# Patient Record
Sex: Male | Born: 1985 | Race: White | Hispanic: No | Marital: Single
Health system: Midwestern US, Community
[De-identification: ages and names within clinical notes are randomized; demographics above are authoritative.]

---

## 2013-12-19 ENCOUNTER — Inpatient Hospital Stay
Admit: 2013-12-19 | Discharge: 2013-12-23 | Disposition: A | Discharge status: Home / Self Care | Attending: Emergency Medicine | Admitting: Emergency Medicine

## 2013-12-19 DIAGNOSIS — A419 Sepsis, unspecified organism: Secondary | ICD-10-CM

## 2013-12-19 LAB — CBC WITH AUTOMATED DIFF
ABS. BASOPHILS: 0 10*3/uL (ref 0.0–0.06)
ABS. EOSINOPHILS: 0 10*3/uL (ref 0.0–0.4)
ABS. LYMPHOCYTES: 1 10*3/uL (ref 0.9–3.6)
ABS. MONOCYTES: 1.5 10*3/uL — ABNORMAL HIGH (ref 0.05–1.2)
ABS. NEUTROPHILS: 22 10*3/uL — ABNORMAL HIGH (ref 1.8–8.0)
BASOPHILS: 0 % (ref 0–2)
EOSINOPHILS: 0 % (ref 0–5)
HCT: 45.4 % (ref 36.0–48.0)
HGB: 16.2 g/dL — ABNORMAL HIGH (ref 13.0–16.0)
LYMPHOCYTES: 4 % — ABNORMAL LOW (ref 21–52)
MCH: 30 PG (ref 24.0–34.0)
MCHC: 35.7 g/dL (ref 31.0–37.0)
MCV: 84.1 FL (ref 74.0–97.0)
MONOCYTES: 6 % (ref 3–10)
MPV: 9.8 FL (ref 9.2–11.8)
NEUTROPHILS: 90 % — ABNORMAL HIGH (ref 40–73)
PLATELET: 314 10*3/uL (ref 135–420)
RBC: 5.4 M/uL (ref 4.70–5.50)
RDW: 13.2 % (ref 11.6–14.5)
WBC: 24.5 10*3/uL — ABNORMAL HIGH (ref 4.6–13.2)

## 2013-12-19 LAB — METABOLIC PANEL, COMPREHENSIVE
A-G Ratio: 1.5 (ref 0.8–1.7)
ALT (SGPT): 22 U/L (ref 16–61)
AST (SGOT): 18 U/L (ref 15–37)
Albumin: 4.7 g/dL (ref 3.4–5.0)
Alk. phosphatase: 70 U/L (ref 45–117)
Anion gap: 14 mmol/L (ref 3.0–18)
BUN/Creatinine ratio: 15 (ref 12–20)
BUN: 17 MG/DL (ref 7.0–18)
Bilirubin, total: 0.6 MG/DL (ref 0.2–1.0)
CO2: 22 mmol/L (ref 21–32)
Calcium: 9.5 MG/DL (ref 8.5–10.1)
Chloride: 105 mmol/L (ref 100–108)
Creatinine: 1.12 MG/DL (ref 0.6–1.3)
GFR est AA: >60 mL/min/{1.73_m2} (ref >60)
GFR est non-AA: >60 mL/min/{1.73_m2} (ref >60)
Globulin: 3.2 g/dL (ref 2.0–4.0)
Glucose: 143 mg/dL — ABNORMAL HIGH (ref 74–99)
Potassium: 3.7 mmol/L (ref 3.5–5.5)
Protein, total: 7.9 g/dL (ref 6.4–8.2)
Sodium: 141 mmol/L (ref 136–145)

## 2013-12-19 LAB — LACTIC ACID
Lactic acid: 2.8 MMOL/L — ABNORMAL HIGH (ref 0.4–2.0)
Lactic acid: 1.2 MMOL/L (ref 0.4–2.0)

## 2013-12-19 LAB — LIPASE: Lipase: 85 U/L (ref 73–393)

## 2013-12-19 MED ORDER — PANTOPRAZOLE 40 MG IV SOLR
40 mg | INTRAVENOUS | Status: AC
Start: 2013-12-19 — End: 2013-12-19
  Administered 2013-12-19: 18:00:00 via INTRAVENOUS

## 2013-12-19 MED ORDER — ONDANSETRON (PF) 4 MG/2 ML INJECTION
4 mg/2 mL | Freq: Once | INTRAMUSCULAR | Status: AC
Start: 2013-12-19 — End: 2013-12-19
  Administered 2013-12-19: 20:00:00 via INTRAVENOUS

## 2013-12-19 MED ORDER — PROMETHAZINE 25 MG/ML INJECTION
25 mg/mL | Freq: Once | INTRAMUSCULAR | Status: DC
Start: 2013-12-19 — End: 2013-12-19

## 2013-12-19 MED ORDER — HYDROMORPHONE (PF) 1 MG/ML IJ SOLN
1 mg/mL | Freq: Once | INTRAMUSCULAR | Status: AC
Start: 2013-12-19 — End: 2013-12-19
  Administered 2013-12-19: 20:00:00 via INTRAVENOUS

## 2013-12-19 MED ORDER — MORPHINE 4 MG/ML SYRINGE
4 mg/mL | INTRAMUSCULAR | Status: AC
Start: 2013-12-19 — End: 2013-12-19
  Administered 2013-12-19: 18:00:00 via INTRAVENOUS

## 2013-12-19 MED ORDER — IOVERSOL 320 MG/ML IV SYRINGE
320 mg iodine/mL | Freq: Once | INTRAVENOUS | Status: AC
Start: 2013-12-19 — End: 2013-12-19
  Administered 2013-12-19: 22:00:00 via INTRAVENOUS

## 2013-12-19 MED ORDER — HYDROMORPHONE (PF) 1 MG/ML IJ SOLN
1 mg/mL | Freq: Once | INTRAMUSCULAR | Status: AC
Start: 2013-12-19 — End: 2013-12-20

## 2013-12-19 MED ORDER — METRONIDAZOLE IN SODIUM CHLORIDE (ISO-OSM) 500 MG/100 ML IV PIGGY BACK
500 mg/100 mL | INTRAVENOUS | Status: AC
Start: 2013-12-19 — End: 2013-12-19
  Administered 2013-12-20: via INTRAVENOUS

## 2013-12-19 MED ADMIN — sodium chloride 0.9 % bolus infusion 1,000 mL: INTRAVENOUS | @ 18:00:00 | NDC 00409798309

## 2013-12-19 MED ADMIN — sodium chloride 0.9 % bolus infusion 1,000 mL: INTRAVENOUS | @ 20:00:00 | NDC 00409798309

## 2013-12-19 MED ADMIN — famotidine (PEPCID) IVPB 20 mg: INTRAVENOUS | @ 19:00:00 | NDC 00338519741

## 2013-12-19 MED ADMIN — ciprofloxacin (CIPRO) 400 mg IVPB (premix): INTRAVENOUS | @ 23:00:00 | NDC 00409477702

## 2013-12-19 MED ADMIN — sodium chloride 0.9 % bolus infusion 1,000 mL: INTRAVENOUS | @ 19:00:00 | NDC 00409798309

## 2013-12-19 MED ADMIN — promethazine (PHENERGAN) 12.5 mg in 0.9% sodium chloride 50 mL IVPB: INTRAVENOUS | @ 19:00:00 | NDC 00641092821

## 2013-12-19 MED FILL — SODIUM CHLORIDE 0.9 % IV: INTRAVENOUS | Qty: 1000

## 2013-12-19 MED FILL — FAMOTIDINE (PF) 20 MG/2 ML IV: 20 mg/2 mL | INTRAVENOUS | Qty: 4

## 2013-12-19 MED FILL — PROMETHAZINE 25 MG/ML INJECTION: 25 mg/mL | INTRAMUSCULAR | Qty: 0.5

## 2013-12-19 MED FILL — PROTONIX 40 MG INTRAVENOUS SOLUTION: 40 mg | INTRAVENOUS | Qty: 40

## 2013-12-19 MED FILL — METRONIDAZOLE IN SODIUM CHLORIDE (ISO-OSM) 500 MG/100 ML IV PIGGY BACK: 500 mg/100 mL | INTRAVENOUS | Qty: 100

## 2013-12-19 MED FILL — TRANSDERM-SCOP 1 MG OVER 3 DAYS TRANSDERMAL PATCH: 1 mg over 3 days | TRANSDERMAL | Qty: 1

## 2013-12-19 MED FILL — MORPHINE 4 MG/ML SYRINGE: 4 mg/mL | INTRAMUSCULAR | Qty: 1

## 2013-12-19 MED FILL — ONDANSETRON (PF) 4 MG/2 ML INJECTION: 4 mg/2 mL | INTRAMUSCULAR | Qty: 2

## 2013-12-19 MED FILL — CIPROFLOXACIN IN D5W 400 MG/200 ML IV PIGGY BACK: 400 mg/200 mL | INTRAVENOUS | Qty: 200

## 2013-12-19 MED FILL — OPTIRAY 320 MG IODINE/ML INTRAVENOUS SYRINGE: 320 mg iodine/mL | INTRAVENOUS | Qty: 100

## 2013-12-19 MED FILL — HYDROMORPHONE (PF) 1 MG/ML IJ SOLN: 1 mg/mL | INTRAMUSCULAR | Qty: 1

## 2013-12-19 MED FILL — FAMOTIDINE 20 MG IN 50 ML NS IVPB: 20 mg/50 mL | INTRAVENOUS | Qty: 20

## 2013-12-19 MED FILL — PROCHLORPERAZINE 25 MG RECTAL SUPPOSITORY: 25 mg | RECTAL | Qty: 1

## 2013-12-19 NOTE — ED Notes (Signed)
He has been having N/V/D since this am. He is having chills.

## 2013-12-19 NOTE — ED Notes (Deleted)
Pt states he feels better with the catheter, pt states the feeling of pressure and urge to void is gone for the first time in 3 days.

## 2013-12-19 NOTE — ED Notes (Signed)
Pt medicated as ordered for continued right upper quadrant abdominal pain. Pt has IV ns infusing with out difficulty. Pt is no longer dry heaving, call light with in reach lights turned down for added comfort. Pt mother at bedside. Pt verbalizes understanding of waiting for CT . Pt reports he last had pizza with hot sauce last night and he started to vomit this am.

## 2013-12-19 NOTE — Progress Notes (Signed)
Called nadine to sed patient for CT of Abd Pel

## 2013-12-19 NOTE — ED Notes (Signed)
Bedside report complete. Patient was introduced to oncoming MGM MIRAGEKelly Beaurmier, Charity fundraiserN. Patient updated on plan of care. Safety check performed: Bed locked in low posiiton. Side rails up. Call bell and personal items within reach. Mother at bed side. Patient continues to have intermittent vomiting

## 2013-12-19 NOTE — H&P (Signed)
Medicine History and Physical    George [name] is a 28 y.o. male who is being admitted for sepsis due to colitis  Primary Care Provider:  None  Date of Admission:  12/19/2013    Assessment/Plan   1. Sepsis due to colitis'  2. Colitis, infectious v inflammatory . 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 Leukocytosis related to above  4. Protein calorie malnutrition    PLAN:  - admit to medicine  - IV hydration  - start cipro/ flagyl  - obtain stool culture, cdiff pcr, stool O &P, CRP, ESR, ANA, pANCA, HIV  - obtain records from recent hospitalization  - consult gastroenterology      HPI:   CC: nausea/ vomitign diarrhea  MOMIN MISKO is a 28 y.o. male with no significant past medical history presents with acute onset of epigastric abdominal pain associated w nausea, vomiting and diarrhea. He reports his symptoms started this morning and have progressed in nature, prompting him to seek emergent care as he has not been able to keep down any po. His mother at bedside reports that since his deployment to [place] 4 years ago, he has had multiple episodes of similar symptoms, requiring hospitalization. Once he was hospitalized in [place] upon his return, and he was hospitalized [facility] years ago [facility]. He had an EGD and colonoscopy at that time which she reports was " negative". Despite this he continues to lose weight despite adequate po intake.     On presentation to the Er he was afebrile, hypotensive with out tachcyardia. He is cachectic and malnourished. Abdomin was soft, but with diffuse tenderness in his epigastryum. Labs w significant leukocytosis w left shift, elevated lactic acid. CT abdomen pelvis with diffuse mural thickening of rectum, sigmoid and left colon. Medicine is asked to admit for further management.         History reviewed. No pertinent past medical history.  Past Surgical History    Procedure Laterality Date   ??? Hx cholecystectomy        History   Substance Use Topics   ??? Smoking status: Current Every Day Smoker -- 0.50 packs/day   ??? Smokeless tobacco: Not on file   ??? Alcohol Use: No     History reviewed. No pertinent family history.  No current facility-administered medications on file prior to encounter.     No current outpatient prescriptions on file prior to encounter.      No Known Allergies      Review of Systems  Constitutional: No fever, chills,+weight loss - falls  Skin: no itching or rashes  HEENT: no ear discomfort, hearing loss, tinnitus, epistaxis or sore throat  EYES: no blurry vision, double vision or photophobia  CARDIOVASCULAR: no claudication, cp, palpitations, orthopnea, pnd or LE edema  PULMONARY: no cough, wheeze, shortness of breath or sputum production  GI: + nausea, +vomiting,+ diarrhea, +abdominal pain,- melena, hematemesis or brbpr  GU: no dysuria, hematuria  MUSCULOSKELETAL: no back pain, joint pain or myalgias  ENDOCRINE: no heat/cold intolerance, polyuria or polydipsia  HEME: no easy bruising or bleeding  NEURO: no unilateral weakness, numbness, tingling or seizures      Physical Exam:      Visit Vitals   Item Reading   ??? BP 108/64   ??? Pulse 63   ??? Temp 98.1 ??F (36.7 ??C)   ??? Resp 16   ???  Ht 5' 6" (1.676 m)   ??? Wt 61.236 kg (135 lb)   ??? BMI 21.80 kg/m2   ??? SpO2 97%     Body mass index is 21.8 kg/(m^2).    Physical Exam:  GEN: thin  HEENT: atraumatic, hearing adequate, nose normal,oropharynx clear, MMdry  NECK: supple, trachea midline, no supraclavicular or submandibular adenopathy noted  EYES: conjuctiva normal, lids with out lesions, PERRL  HEART: RRR with out m/r/g, pmi nondisplaced, pulses 2+ distally  LUNGS: equal chest wall expansion, cta bl with out wheezes/rales or rhonchi  AB: soft, +BS, nt/nd no organomegaly  NEURO: alert, awake and oriented x3, gait not assessed, cranial nerves intact, strength 5/5 bl UE and LE, sensation intact, reflexes nonpathological   SKIN: dry, intact, warm no breakdown noted        Laboratory Studies:    BMP:   Lab Results   Component Value Date/Time    NA 141 12/19/2013  1:25 PM    K 3.7 12/19/2013  1:25 PM    CL 105 12/19/2013  1:25 PM    CO2 22 12/19/2013  1:25 PM    AGAP 14 12/19/2013  1:25 PM    GLU 143* 12/19/2013  1:25 PM    BUN 17 12/19/2013  1:25 PM    CREA 1.12 12/19/2013  1:25 PM    GFRAA >60 12/19/2013  1:25 PM    GFRNA >60 12/19/2013  1:25 PM     CBC:   Lab Results   Component Value Date/Time    WBC 24.5* 12/19/2013  1:25 PM    HGB 16.2* 12/19/2013  1:25 PM    HCT 45.4 12/19/2013  1:25 PM    PLT 314 12/19/2013  1:25 PM       Imaging studies personally reviewed:  CT AB/PELVIS: " 1. Diffuse mural thickening of the rectum, sigmoid colon, and left colon  suggesting an infectious/inflammatory colitis. Clinical correlation is  recommended.  2. Previous cholecystectomy."       Berline Chough, DO  Internal Medicine/Geriatrics

## 2013-12-19 NOTE — ED Notes (Signed)
Patient has vomited twice more since he has been there.

## 2013-12-19 NOTE — Other (Signed)
Bedside shift change report given to C. Adhiambo (Cabin crewoncoming nurse) by Silas Sacramentoortni B Kunzie, RN   (offgoing nurse). Report included the following information SBAR, Kardex, ED Summary, [facility] and Recent Results.

## 2013-12-19 NOTE — ED Notes (Signed)
Assumed care of patient, bedside report received from Barbie Robins RN.

## 2013-12-19 NOTE — ED Notes (Signed)
TRANSFER - OUT REPORT:    Verbal report given to Jerl Minaourtney Kunzie RN(name) on Ryder SystemCorey R Hardrick  being transferred to Medical (unit) for routine progression of care       Report consisted of patient???s Situation, Background, Assessment and   Recommendations(SBAR).     Information from the following report(s) SBAR and ED Summary was reviewed with the receiving nurse.    Opportunity for questions and clarification was provided.    Courtney verbalizes understanding that the Flagyl abx will go to floor with patient to be started once the cipro completes.

## 2013-12-19 NOTE — Progress Notes (Addendum)
Received patient from the off going nurse,he's alert and oriented x4 verbal and able to make needs known.Vitals stable with diastolic blood pressure  In the 40's but patient reports this to be normal.Assesment completed per doc flow sheet ,patient refused both flu and pneumonia vaccines MD made  Aware will continue to monitor.  2247  Patient complaining of severe abdominal pain and emesis episodes, greenish brown large amount of vomit noted.Zofran administered,MD contacted and i.v dilaudid ordered  0715  The rest of the night remain calm,with pain well managed and no more emesis episodes.

## 2013-12-19 NOTE — ED Notes (Signed)
Pt dozing on stretcher with lights turned down for added comfort. Pt mother remains at bedside and verbalizes understanding of going to CT shortly, call light with in reach.

## 2013-12-19 NOTE — ED Notes (Signed)
This rn was told by ed rn that she spoke with lab and per the lab they will draw the lab work that was just added on by Dr. Pauletta BrownsAlmedia with morning labs because that lab work is a send out and would not be sent out until the morning anyway.

## 2013-12-19 NOTE — Progress Notes (Signed)
1835: Received pt from ED at this time. Pt c/o N/V, had small amount of green colored emesis and watery stool. Ciprofloxacin infusing at 200 ml/h, mother at bedside. Call bell in reach.

## 2013-12-19 NOTE — ED Provider Notes (Signed)
HPI Comments: 1:12 PM  George [name] 28 y.o. male presents to ED via EMS C/O NVD onset this morning. Associated sxs include chills and epgiastric abdominal pain- sharp, stabbing, doesn't radiate. Symptoms moderate-severe. No blood. No hx pancreatitis, PUD, GI bleed. No etoh use. No sick contacts or any other sxs or complaints. PSHx + chole.     The history is provided by the patient. No language interpreter was used.        History reviewed. No pertinent past medical history.     Past Surgical History   Procedure Laterality Date   ??? Hx cholecystectomy           History reviewed. No pertinent family history.     History     Social History   ??? Marital Status: SINGLE     Spouse Name: N/A     Number of Children: N/A   ??? Years of Education: N/A     Occupational History   ??? Not on file.     Social History Main Topics   ??? Smoking status: Current Every Day Smoker -- 0.50 packs/day   ??? Smokeless tobacco: Not on file   ??? Alcohol Use: No   ??? Drug Use: Yes     Special: Marijuana   ??? Sexual Activity: Not on file     Other Topics Concern   ??? Not on file     Social History Narrative   ??? No narrative on file                  ALLERGIES: Review of patient's allergies indicates no known allergies.      Review of Systems   Constitutional: Positive for chills. Negative for fever and fatigue.   HENT: Negative for rhinorrhea and sore throat.    Eyes: Negative for discharge.   Respiratory: Negative for cough.    Gastrointestinal: Positive for nausea, vomiting, abdominal pain and diarrhea. Negative for blood in stool.   Genitourinary: Negative for decreased urine volume.   Musculoskeletal: Negative for myalgias and arthralgias.   Skin: Negative for color change, pallor, rash and wound.   Neurological: Negative for dizziness, weakness and headaches.   Hematological: Negative.    Psychiatric/Behavioral: Negative.        Filed Vitals:    12/19/13 1330 12/19/13 1430 12/19/13 1517 12/19/13 1630   BP: 104/58 116/72 114/48 108/51   Pulse: 71 63  63    Temp:       Resp: 17 25 14     Height:       Weight:       SpO2:   100% 97%            Physical Exam   Nursing note and vitals reviewed.  NURSING NOTE AND VITALS REVIEWED.    PSHX, PFHX, PMHX REVIEWED.    CONSTITUTIONAL: Well developed, well nourished. Awake & alert. No acute respiratory distress, non-toxic in appearance, not diaphoretic. Afebrile. Ill appearing.     HEAD: Atraumatic, Normocephalic.    EYES: Extra-ocular muscles are intact. Sclera are anicteric.     ENT: Ears normal to external inspection. Mucous membranes dry. Nares patent. OP is clear. No trismus. No stridor. Secretions controlled.     NECK:  Neck is supple without meningismus.    CARDIOVASCULAR: Regular rate and rhythm, no murmurs, rubs, or gallops.      PULMONARY/CHEST:  Clear to auscultation bilaterally. No wheezing, rales or rhonchi. Patient speaking in full sentences without accessory muscle usage.  ABDOMINAL: Soft and non-distended. Active BS all quadrants. Generalized tenderness, greatest in RUQ w/ + guarding. No rebound.    MUSCULOSKELETAL: Full range of motion in all extremities.     SKIN:  Skin is warm and dry. No diaphoresis, rashes or lesions visible. Intact skin.     NEUROLOGICAL: Alert, awake. Appropriately oriented. Normal speech.          RESULTS    Labs Reviewed   CBC WITH AUTOMATED DIFF - Abnormal; Notable for the following:     WBC 24.5 (*)     HGB 16.2 (*)     NEUTROPHILS 90 (*)     LYMPHOCYTES 4 (*)     ABS. NEUTROPHILS 22.0 (*)     ABS. MONOCYTES 1.5 (*)     All other components within normal limits   METABOLIC PANEL, COMPREHENSIVE - Abnormal; Notable for the following:     Glucose 143 (*)     All other components within normal limits   LACTIC ACID, PLASMA - Abnormal; Notable for the following:     Lactic acid 2.8 (*)     All other components within normal limits   CULTURE, BLOOD   LIPASE   LACTIC ACID, PLASMA     Recent Results (from the past 12 hour(s))   CBC WITH AUTOMATED DIFF    Collection Time     12/19/13  1:25  PM       Result Value Ref Range    WBC 24.5 (*) 4.6 - 13.2 K/uL    RBC 5.40  4.70 - 5.50 M/uL    HGB 16.2 (*) 13.0 - 16.0 g/dL    HCT 45.4  36.0 - 48.0 %    MCV 84.1  74.0 - 97.0 FL    MCH 30.0  24.0 - 34.0 PG    MCHC 35.7  31.0 - 37.0 g/dL    RDW 13.2  11.6 - 14.5 %    PLATELET 314  135 - 420 K/uL    MPV 9.8  9.2 - 11.8 FL    NEUTROPHILS 90 (*) 40 - 73 %    LYMPHOCYTES 4 (*) 21 - 52 %    MONOCYTES 6  3 - 10 %    EOSINOPHILS 0  0 - 5 %    BASOPHILS 0  0 - 2 %    ABS. NEUTROPHILS 22.0 (*) 1.8 - 8.0 K/UL    ABS. LYMPHOCYTES 1.0  0.9 - 3.6 K/UL    ABS. MONOCYTES 1.5 (*) 0.05 - 1.2 K/UL    ABS. EOSINOPHILS 0.0  0.0 - 0.4 K/UL    ABS. BASOPHILS 0.0  0.0 - 0.06 K/UL    RBC COMMENTS NORMOCYTIC, NORMOCHROMIC      WBC COMMENTS SLIDE ESTIMATE CONFIRMS WBC      DF AUTOMATED     METABOLIC PANEL, COMPREHENSIVE    Collection Time     12/19/13  1:25 PM       Result Value Ref Range    Sodium 141  136 - 145 mmol/L    Potassium 3.7  3.5 - 5.5 mmol/L    Chloride 105  100 - 108 mmol/L    CO2 22  21 - 32 mmol/L    Anion gap 14  3.0 - 18 mmol/L    Glucose 143 (*) 74 - 99 mg/dL    BUN 17  7.0 - 18 MG/DL    Creatinine 1.12  0.6 - 1.3 MG/DL    BUN/Creatinine ratio 15  12 -  20      GFR est AA >60  >60 ml/min/1.25m    GFR est non-AA >60  >60 ml/min/1.771m   Calcium 9.5  8.5 - 10.1 MG/DL    Bilirubin, total 0.6  0.2 - 1.0 MG/DL    ALT 22  16 - 61 U/L    AST 18  15 - 37 U/L    Alk. phosphatase 70  45 - 117 U/L    Protein, total 7.9  6.4 - 8.2 g/dL    Albumin 4.7  3.4 - 5.0 g/dL    Globulin 3.2  2.0 - 4.0 g/dL    A-G Ratio 1.5  0.8 - 1.7     LIPASE    Collection Time     12/19/13  1:25 PM       Result Value Ref Range    Lipase 85  73 - 393 U/L   LACTIC ACID, PLASMA    Collection Time     12/19/13  1:25 PM       Result Value Ref Range    Lactic acid 2.8 (*) 0.4 - 2.0 MMOL/L         History reviewed. No pertinent past medical history.   XR ABD ACUTE W 1 V CHEST    (Results Pending)   CT ABD PELV W CONT    (Results Pending)        MDM  Number of  Diagnoses or Management Options  Abdominal pain, generalized:   Intractable nausea and vomiting:   Pancolitis:   Diagnosis management comments:   DDx: consider biliary, hepatitis, pancreatitis, gastritis, viral syndrome,gastroparesis, SBO, appendicitis, diverticulitis, colitis    + colitis w/ white count, intractable N/V and continued ABD pain. WaJuliandmission for continued IVF/IVABX/antiemetics. Dr. AlBuel Reamgrees to admit. Plan discussed with his mom.                Amount and/or Complexity of Data Reviewed  Clinical lab tests: ordered and reviewed  Tests in the radiology section of CPT??: ordered and reviewed  Discuss the patient with other providers: yes (Dr.Almeida-IM)  Independent visualization of images, tracings, or specimens: yes        MEDICATIONS  Medications   scopolamine (TRANSDERM-SCOP) 1.5 mg (not administered)   ciprofloxacin (CIPRO) 400 mg IVPB (premix) (not administered)   metroNIDAZOLE (FLAGYL) IVPB premix 500 mg (not administered)   HYDROmorphone (PF) (DILAUDID) injection 0.5 mg (not administered)   sodium chloride 0.9 % bolus infusion 1,000 mL (0 mL IntraVENous IV Completed 12/19/13 1514)   sodium chloride 0.9 % bolus infusion 1,000 mL (0 mL IntraVENous IV Completed 12/19/13 1403)   pantoprazole (PROTONIX) injection 40 mg (40 mg IntraVENous Given 12/19/13 1323)   morphine injection 4 mg (4 mg IntraVENous Given 12/19/13 1323)   promethazine (PHENERGAN) 12.5 mg in 0.9% sodium chloride 50 mL IVPB (0 mg IntraVENous IV Completed 12/19/13 1339)   famotidine (PEPCID) IVPB 20 mg (0 mg IntraVENous IV Completed 12/19/13 1432)   ondansetron (ZOFRAN) injection 4 mg (4 mg IntraVENous Given 12/19/13 1516)   HYDROmorphone (PF) (DILAUDID) injection 1 mg (1 mg IntraVENous Given 12/19/13 1516)   sodium chloride 0.9 % bolus infusion 1,000 mL (0 mL IntraVENous IV Completed 12/19/13 1607)   ioversol (OPTIRAY) 320 mg iodine/mL contrast injection 100 mL (100 mL IntraVENous Given 12/19/13 1651)         Procedures    PROGRESS  NOTES:    3:51 PM  Pt's mother requested to speak privately, endorses ongoing chronic  abd pain, 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 Has been extensively evaluated inducing endoscopy and colonoscopy without source for sxs. Pt is a veteran, has had extensive ETOH hx in the past but nothing recent. Lives at home with parents. Moms does not think any recent drinking and unsure of drug use.   Written by Lillia Mountain, ED Scribe, as dictated by Marden Noble, PA-C.        CONSULT NOTE:   5:35 PM  Marden Noble, PA-C spoke with [facility]   Specialty: Internal Medicine   Discussed pt's hx, disposition, and available diagnostic and imaging results. Reviewed care plans. Consulting physician agrees with plans as outlined. Agrees to admit pt to medical and suggests Cipro and Flagyl IV antibiotics to be started.   Written by Celine Mans, ED Scribe, as dictated by Marden Noble, PA-C.     ADMISSION NOTE:  5:35 PM  Patient is being admitted to the hospital by Dr.Almeida. The results of their tests and reasons for their admission have been discussed with them and/or available family. They convey agreement and understanding for the need to be admitted and for their admission diagnosis.    Written by Celine Mans, ED Scribe, as dictated by Marden Noble, PA-C.     CONDITIONS ON ADMISSION:  MRSA is not present at the time of admission. Wound infection is not present at the time of admission. Pressure Ulcer is not present at the time of admission.     ________________________________________________________________________  CLINICAL IMPRESSION    1. Pancolitis    2. Intractable nausea and vomiting    3. Abdominal pain, generalized                  Written by Lillia Mountain, ED Scribe, as dictated by Marden Noble, PA-C.

## 2013-12-19 NOTE — ED Notes (Signed)
Pt resting on stretcher, pt remains drowsy, pt mother at the bedside and was helping to answer questions when dr. Pauletta BrownsAlmedia was at the bedside.

## 2013-12-20 LAB — METABOLIC PANEL, BASIC
Anion gap: 11 mmol/L (ref 3.0–18)
BUN/Creatinine ratio: 11 — ABNORMAL LOW (ref 12–20)
BUN: 11 MG/DL (ref 7.0–18)
CO2: 23 mmol/L (ref 21–32)
Calcium: 8.2 MG/DL — ABNORMAL LOW (ref 8.5–10.1)
Chloride: 107 mmol/L (ref 100–108)
Creatinine: 0.96 MG/DL (ref 0.6–1.3)
GFR est AA: >60 mL/min/{1.73_m2} (ref >60)
GFR est non-AA: >60 mL/min/{1.73_m2} (ref >60)
Glucose: 95 mg/dL (ref 74–99)
Potassium: 3.3 mmol/L — ABNORMAL LOW (ref 3.5–5.5)
Sodium: 141 mmol/L (ref 136–145)

## 2013-12-20 LAB — C REACTIVE PROTEIN, QT: C-Reactive protein: 2.8 mg/dL — ABNORMAL HIGH (ref 0–0.3)

## 2013-12-20 LAB — SED RATE (ESR): Sed rate, automated: 1 mm/hr (ref 0–15)

## 2013-12-20 LAB — CBC WITH AUTOMATED DIFF
ABS. BASOPHILS: 0 10*3/uL (ref 0.0–0.06)
ABS. EOSINOPHILS: 0 10*3/uL (ref 0.0–0.4)
ABS. LYMPHOCYTES: 0.9 10*3/uL (ref 0.9–3.6)
ABS. MONOCYTES: 0.8 10*3/uL (ref 0.05–1.2)
ABS. NEUTROPHILS: 11.6 10*3/uL — ABNORMAL HIGH (ref 1.8–8.0)
BASOPHILS: 0 % (ref 0–2)
EOSINOPHILS: 0 % (ref 0–5)
HCT: 37 % (ref 36.0–48.0)
HGB: 13 g/dL (ref 13.0–16.0)
LYMPHOCYTES: 6 % — ABNORMAL LOW (ref 21–52)
MCH: 29.5 PG (ref 24.0–34.0)
MCHC: 35.1 g/dL (ref 31.0–37.0)
MCV: 84.1 FL (ref 74.0–97.0)
MONOCYTES: 6 % (ref 3–10)
MPV: 10 FL (ref 9.2–11.8)
NEUTROPHILS: 88 % — ABNORMAL HIGH (ref 40–73)
PLATELET: 215 10*3/uL (ref 135–420)
RBC: 4.4 M/uL — ABNORMAL LOW (ref 4.70–5.50)
RDW: 13.3 % (ref 11.6–14.5)
WBC: 13.3 10*3/uL — ABNORMAL HIGH (ref 4.6–13.2)

## 2013-12-20 LAB — LACTIC ACID: Lactic acid: 1 MMOL/L (ref 0.4–2.0)

## 2013-12-20 LAB — C. DIFFICILE/EPI PCR

## 2013-12-20 LAB — MAGNESIUM: Magnesium: 1.7 mg/dL — ABNORMAL LOW (ref 1.8–2.4)

## 2013-12-20 LAB — PHOSPHORUS: Phosphorus: 1.9 MG/DL — ABNORMAL LOW (ref 2.5–4.9)

## 2013-12-20 MED ADMIN — ondansetron (ZOFRAN) injection 4 mg: INTRAVENOUS | @ 19:00:00 | NDC 10019090501

## 2013-12-20 MED ADMIN — HYDROmorphone (PF) (DILAUDID) injection 1 mg: INTRAVENOUS | @ 17:00:00 | NDC 00409128331

## 2013-12-20 MED ADMIN — 0.9% sodium chloride infusion: INTRAVENOUS | NDC 00409798309

## 2013-12-20 MED ADMIN — 0.9% sodium chloride infusion: INTRAVENOUS | @ 12:00:00 | NDC 00409798309

## 2013-12-20 MED ADMIN — HYDROmorphone (PF) (DILAUDID) injection 1 mg: INTRAVENOUS | @ 09:00:00 | NDC 00409128331

## 2013-12-20 MED ADMIN — ondansetron (ZOFRAN) injection 4 mg: INTRAVENOUS | @ 03:00:00 | NDC 23155037831

## 2013-12-20 MED ADMIN — enoxaparin (LOVENOX) injection 40 mg: SUBCUTANEOUS | @ 03:00:00 | NDC 00075801401

## 2013-12-20 MED ADMIN — enoxaparin (LOVENOX) injection 40 mg: SUBCUTANEOUS | NDC 00075801401

## 2013-12-20 MED ADMIN — HYDROmorphone (PF) (DILAUDID) injection 1 mg: INTRAVENOUS | @ 04:00:00 | NDC 00409128331

## 2013-12-20 MED ADMIN — ondansetron (ZOFRAN) injection 4 mg: INTRAVENOUS | @ 14:00:00 | NDC 23155037831

## 2013-12-20 MED ADMIN — 0.9% sodium chloride infusion: INTRAVENOUS | @ 22:00:00 | NDC 00409798309

## 2013-12-20 MED ADMIN — HYDROmorphone (PF) (DILAUDID) injection 1 mg: INTRAVENOUS | @ 13:00:00 | NDC 00409128331

## 2013-12-20 MED FILL — ONDANSETRON (PF) 4 MG/2 ML INJECTION: 4 mg/2 mL | INTRAMUSCULAR | Qty: 2

## 2013-12-20 MED FILL — LOVENOX 40 MG/0.4 ML SUBCUTANEOUS SYRINGE: 40 mg/0.4 mL | SUBCUTANEOUS | Qty: 0.4

## 2013-12-20 MED FILL — HYDROMORPHONE (PF) 1 MG/ML IJ SOLN: 1 mg/mL | INTRAMUSCULAR | Qty: 1

## 2013-12-20 NOTE — Other (Signed)
[place] [address]  NPI: [id number] / Tax ID: [id number]    Patient [employer] ID: [id number]    Colitis 558.9   12/19/2013 - Present         H&P Notes    H&P by Berline Chough, DO at 12/19/13 1755 Version 1 of 1   Author: Berline Chough, DO Service: Internal Medicine Author Type: Physician   Filed: 12/19/13 1808 Note Time: 12/19/13 1755 Status: Signed   Editor: Berline Chough, DO (Physician)       Medicine History and Physical    George [name] is a 28 y.o. male who is being admitted for sepsis due to colitis  Primary Care Provider: None  Date of Admission: 12/19/2013  Assessment/Plan   1. Sepsis due to colitis'  2. Colitis, infectious v inflammatory . 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 Leukocytosis related to above  4. Protein calorie malnutrition    PLAN:  - admit to medicine  - IV hydration  - start cipro/ flagyl  - obtain stool culture, cdiff pcr, stool O &P, CRP, ESR, ANA, pANCA, HIV  - obtain records from recent hospitalization  - consult gastroenterology    HPI:   CC: nausea/ vomitign diarrhea  George [name] is a 28 y.o. male with no significant past medical history presents with acute onset of epigastric abdominal pain associated w nausea, vomiting and diarrhea. He reports his symptoms started this morning and have progressed in nature, prompting him to seek emergent care as he has not been able to keep down any po. His mother at bedside reports that since his deployment to [place] 4 years ago, he has had multiple episodes of similar symptoms, requiring hospitalization. Once he was hospitalized in [place] upon his return, and he was hospitalized 2 years ago [facility]. He had an EGD and colonoscopy at that time which she reports was " negative". Despite this he continues to lose weight despite adequate po intake.     On presentation to the  Er he was afebrile, hypotensive with out tachcyardia. He is cachectic and malnourished. Abdomin was soft, but with diffuse tenderness in his epigastryum. Labs w significant leukocytosis w left shift, elevated lactic acid. CT abdomen pelvis with diffuse mural thickening of rectum, sigmoid and left colon. Medicine is asked to admit for further management.    Past Medical History    History reviewed. No pertinent past medical history.    Past Surgical History    Past Surgical History   Procedure Laterality Date   ??? Hx cholecystectomy        History   Substance Use Topics   ??? Smoking status: Current Every Day Smoker -- 0.50 packs/day   ??? Smokeless tobacco: Not on file   ??? Alcohol Use: No   Family History    History reviewed. No pertinent family history.   No current facility-administered medications on file prior to encounter.   No current outpatient prescriptions on file prior to encounter.   No Known Allergies      Review of Systems  Constitutional: No fever, chills,+weight loss - falls  Skin: no itching or rashes  HEENT: no ear discomfort, hearing loss, tinnitus, epistaxis or sore throat  EYES: no blurry vision, double vision or photophobia  CARDIOVASCULAR: no claudication, cp, palpitations, orthopnea, pnd or LE edema  PULMONARY:  no cough, wheeze, shortness of breath or sputum production  GI: + nausea, +vomiting,+ diarrhea, +abdominal pain,- melena, hematemesis or brbpr  GU: no dysuria, hematuria  MUSCULOSKELETAL: no back pain, joint pain or myalgias  ENDOCRINE: no heat/cold intolerance, polyuria or polydipsia  HEME: no easy bruising or bleeding  NEURO: no unilateral weakness, numbness, tingling or seizures    Physical Exam:   Visit Vitals   Item Reading   ??? BP 108/64   ??? Pulse 63   ??? Temp 98.1 ??F (36.7 ??C)   ??? Resp 16   ??? Ht 5' 6"  (1.676 m)   ??? Wt 61.236 kg (135 lb)   ??? BMI 21.80 kg/m2   ??? SpO2 97%   Body mass index is 21.8 kg/(m^2).    Physical Exam:  GEN: thin  HEENT: atraumatic, hearing adequate, nose  normal,oropharynx clear, MMdry  NECK: supple, trachea midline, no supraclavicular or submandibular adenopathy noted  EYES: conjuctiva normal, lids with out lesions, PERRL  HEART: RRR with out m/r/g, pmi nondisplaced, pulses 2+ distally  LUNGS: equal chest wall expansion, cta bl with out wheezes/rales or rhonchi  AB: soft, +BS, nt/nd no organomegaly  NEURO: alert, awake and oriented x3, gait not assessed, cranial nerves intact, strength 5/5 bl UE and LE, sensation intact, reflexes nonpathological  SKIN: dry, intact, warm no breakdown noted    Laboratory Studies:    BMP:   Lab Results   Component Value Date/Time    NA 141 12/19/2013 1:25 PM    K 3.7 12/19/2013 1:25 PM    CL 105 12/19/2013 1:25 PM    CO2 22 12/19/2013 1:25 PM    AGAP 14 12/19/2013 1:25 PM    GLU 143* 12/19/2013 1:25 PM    BUN 17 12/19/2013 1:25 PM    CREA 1.12 12/19/2013 1:25 PM    GFRAA >60 12/19/2013 1:25 PM    GFRNA >60 12/19/2013 1:25 PM   CBC:   Lab Results   Component Value Date/Time    WBC 24.5* 12/19/2013 1:25 PM    HGB 16.2* 12/19/2013 1:25 PM    HCT 45.4 12/19/2013 1:25 PM    PLT 314 12/19/2013 1:25 PM     Imaging studies personally reviewed:  CT AB/PELVIS: " 1. Diffuse mural thickening of the rectum, sigmoid colon, and left colon  suggesting an infectious/inflammatory colitis. Clinical correlation is  recommended.  2. Previous cholecystectomy."    Berline Chough, DO  Internal Medicine/Geriatrics

## 2013-12-20 NOTE — Progress Notes (Signed)
Readmission Risk Assessment:     Low Risk and MSSP/Good Help ACO patients    RRAT Score:  1 - 9    Initial Assessment: Chart reviewed, noted 28 yr old admitted from home with sepsis due to colitis; noted pt with [facility] ins.  Attempted to meet with pt who was not available.  Will f/u pt in the am.       Emergency Contact:      Pertinent Medical Hx: cholecystectomy        PCP/Specialists:       Community Services:       DME:         Low Risk Care Transition Plan:  1. [facility], community care coordination of resources  2. Involve patient/caregiver in assessment, planning, education and implement of intervention.  3. CM daily patient care huddles/interdisciplinary rounds.  4. PCP/Specialist appointment within 7 - 10 days made prior to discharge.  5. Facilitate transportation and logistics for follow-up appointments.  6. Handoff to [facility] Nurse Navigator or PCP practice.

## 2013-12-20 NOTE — Other (Signed)
Bedside and Verbal shift change report given to C.Kunzi RN (oncoming nurse) by Caroline Adhiambo, RN   (offgoing nurse). Report included the following information SBAR, Kardex and MAR.

## 2013-12-20 NOTE — Progress Notes (Signed)
Medicine Progress Note        Patient: George [name]        Sex: male          DOA: 12/19/2013  Date of Birth:  1985-12-08      Age:  28 y.o.        LOS:  LOS: 1 day   Assessment/Plan   1. Sepsis due to colitis  2. Colitis, likely infectious, improving  3. Leukocytosis, improved  4. Protein calorie malnutrition    Plan:  - continue cipro/ flagyl  - IV fluids  - clear liquid  - follow culture data              Subjective:     Pt feeling better  Still has some nausea, but asking to eat  Pain resolved    REVIEW OF SYSTEMS: NO CP, SOB, N,V,D, F,C  Objective:      Visit Vitals   Item Reading   ??? BP 104/45   ??? Pulse 89   ??? Temp 99.1 ??F (37.3 ??C)   ??? Resp 18   ??? Ht 5\' 6"  (1.676 m)   ??? Wt 56.427 kg (124 lb 6.4 oz)   ??? BMI 20.09 kg/m2   ??? SpO2 98%     Body mass index is 20.09 kg/(m^2).    Physical Exam:  GEN: Alert and oriented times three NAD, thin  EYES: conjunctiva normal, lids with out lesions  HEENT: MMM, No thyromegaly, no lymphadenopathy  HEART: RRR +S1 +S2, no JVD, pulses 2+ distally  LUNGS: CTA B/L no wheezes, rales or rhonchi  ABDOMEN: + BS, soft NT/ND no organomegaly,  No rebound  EXTREMITIES: No edema cyanosis   SKIN: no rashes or skin breakdown, no nodules  Current Facility-Administered Medications   Medication Dose Route Frequency   ??? scopolamine (TRANSDERM-SCOP) 1.5 mg  1.5 mg TransDERmal NOW   ??? 0.9% sodium chloride infusion  125 mL/hr IntraVENous CONTINUOUS   ??? ondansetron (ZOFRAN) injection 4 mg  4 mg IntraVENous Q4H PRN   ??? prochlorperazine (COMPAZINE) suppository 25 mg  25 mg Rectal Q12H PRN   ??? acetaminophen (TYLENOL) suppository 650 mg  650 mg Rectal Q4H PRN   ??? acetaminophen (TYLENOL) tablet 650 mg  650 mg Oral Q4H PRN   ??? enoxaparin (LOVENOX) injection 40 mg  40 mg SubCUTAneous Q24H   ??? HYDROmorphone (PF) (DILAUDID) injection 1 mg  1 mg IntraVENous Q3H PRN         All the patient's labs over the past 24 hours were reviewed both during my initial daily workflow process and at  the time notated as "note time" in [facility].  (It is not time stamped separately in this workflow.)  Select labs are listed below.      Lab/Data Reviewed:  CMP:   Lab Results   Component Value Date/Time    NA 141 12/20/2013  4:20 AM    K 3.3* 12/20/2013  4:20 AM    CL 107 12/20/2013  4:20 AM    CO2 23 12/20/2013  4:20 AM    AGAP 11 12/20/2013  4:20 AM    GLU 95 12/20/2013  4:20 AM    BUN 11 12/20/2013  4:20 AM    CREA 0.96 12/20/2013  4:20 AM    GFRAA >60 12/20/2013  4:20 AM    GFRNA >60 12/20/2013  4:20 AM    CA 8.2* 12/20/2013  4:20 AM    MG 1.7* 12/19/2013  1:25 PM  PHOS 1.9* 12/19/2013  1:25 PM    TP 7.9 12/19/2013  1:25 PM    ALB 4.7 12/19/2013  1:25 PM    GLOB 3.2 12/19/2013  1:25 PM    AGRAT 1.5 12/19/2013  1:25 PM    SGOT 18 12/19/2013  1:25 PM    ALT 22 12/19/2013  1:25 PM     CBC:   Lab Results   Component Value Date/Time    WBC 13.3* 12/20/2013  4:20 AM    HGB 13.0 12/20/2013  4:20 AM    HCT 37.0 12/20/2013  4:20 AM    PLT 215 12/20/2013  4:20 AM     Recent Glucose Results:   Lab Results   Component Value Date/Time    GLU 95 12/20/2013  4:20 AM    GLU 143* 12/19/2013  1:25 PM           Esperanza RichtersJacob Leander Tout, DO  Internal Medicine/Geriatrics

## 2013-12-20 NOTE — Consults (Signed)
[facility]                              [address]                          [facility] [place] [address]                                   CONSULTATION    PATIENT:   George [name], George [name]  MRN:           [id number]     DATE:  BILLING:       [id number]    ROOM :    [id number]  INTERPRETI Rozetta Nunnery, MD  NG:  REFERRING: Josphat Musapatike      GASTROENTEROLOGY EVALUATION    HISTORY OF PRESENT ILLNESS: This is a 28 year old white male who was  referred to me for evaluation of his nausea, vomiting and diarrhea episodes  that he presented here with. 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 He had a severe episode [place]  [place] and ended up having his  gallbladder taken out and told there was some fluid around his pancreas  suggestive of inflammation. He has had multiple attacks since that point.  Initially they would occur about 5-6 times a month. In between his stools  were always on the soft or looser side, but now they are down to about 2-3  times a month with these episodes. He was seen at [facility] at one point  and said he had an upper endoscopy and colonoscopy and those all came back  okay. He has had many stool studies done and was told that those were all  okay. These episodes, when they do occur, they can last anywhere from 2  days to 2 weeks. He notes that prior to going to [place] he weighed somewhere  in the 140-150 range, now he is in the 120 range and cannot seem to gain  weight higher than that and when he gets these episodes it usually drops  his weight down a degree and he has to struggle to get it back up to the  starting weight again. 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    PAST MEDICAL HISTORY: Essentially none except for what is noted above.    ALLERGIES: HE HAS NO KNOWN ALLERGIES TO MEDICATIONS.    MEDICATIONS: None at presentation.    PAST SURGICAL HISTORY: Cholecystectomy, wisdom teeth.    FAMILY HISTORY: Both parents alive and well.    SOCIAL HISTORY: Positive for cigarette smoking, about 1/2 a pack per day.  Negative for alcohol intake and he does use marijuana occasionally.    PHYSICAL EXAMINATION  GENERAL: This is a thin male resting comfortably in bed.  VITAL SIGNS: Show blood pressure 104/45 with a pulse rate 89. He is  afebrile, his pulse oximetry is 98%.  HEENT: He has anicteric sclerae,  pink conjunctivae. Pupils equal and  reactive to light. Extraocular muscles are intact. Mouth exam has good  dentition upper and lower with moist mucous membranes, but no other mucosal  or lip abnormalities.  NECK: Midline trachea. No palpable thyroid or nodes.  LUNGS: Clear to auscultation and percussion.  HEART: Has an S1, S2 with a regular rate and rhythm. No murmur  appreciated.  ABDOMEN: Has positive bowel sounds. It is soft. It is nondistended. There  is no hepatosplenomegaly. There are no masses. He has mild tenderness in  the right upper quadrant area and epigastric area.  RECTAL: Exam deferred at this time.  EXTREMITIES: No cyanosis, clubbing, or edema.  NEUROLOGIC/PSYCHIATRIC: Alert and oriented to person, place, time with good  judgment.    LABORATORY DATA: At presentation showed a white count 24,500 with  hemoglobin 16.2, hematocrit 45.1, MCV 84.1, and a platelet count of  314,000. Repeat labs show white blood cell count of 13,300, hemoglobin and  hematocrit down to 13 and 37. Initially sodium 141, potassium 3.7, chloride  105, CO2 22, glucose 143, BUN 17, creatinine of 1.12, calcium 9.5,  phosphorus 1.9, magnesium 1.7, bilirubin 0.6, total protein 7.9 with an  albumin of 4.7, globulin of 3.2, ALT 22, AST 18, alkaline phosphatase 70,  lipase of 85. Lactic acid  initially 2.8. Subsequently, the 2 lactic acids  were down to 1.2 and 1.0 respectively, and his glucose went down to 95, his  calcium went down to 8.2. His blood cultures, stool cultures, stool O and  P, and C difficile are all pending at this point. He also has pending.  C-reactive protein, HIV, antineutrophilic cytoplasmic antibodies and  antinuclear antibodies are all pending.    His CAT scan shows diffuse mural thickening of the rectum, sigmoid and left  colon with previous cholecystectomy changes, otherwise unremarkable.    ASSESSMENT: This is a 28 year old male who comes in with the above history.  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 He has had evaluations  before and nothing significant was found at those points in time.  Unfortunately, I do not have those results. He has already had his  gallbladder taken out for this and the flares seem to be easing off with  the frequency but they could last for extended period times it is affecting  his weight, but now he has got what appears to be a superimposed symptom on  top of his chronic with the CAT scan findings with the white count being so  elevated and now improving. But again I think this is more superimposed  with obvious findings on top of chronic symptoms without a clear cause for  that.    PLAN: At this point, I agree with the current course of his IV fluids that  he is on with his antinausea medication and pain medications as needed with  waiting for the stool results to come back and the only new workup that was  done and just observing for the time being without further invasive repeat  testing needing done at this point in time, with what has been found  before and just making the necessary adjustments as his course dictates.  Will follow and help with.  Rozetta NunneryKeith Roth Ress, MD       KL:wmx  D: 12/20/2013 07:35 A   T: 12/20/2013 08:03 A  BY:  [id number]  #:  [phone]  CScriptDoc #:  [id number]    cc:   Dena BilletJacob D Almeida, DO        Rozetta NunneryKeith Shailynn Fong, MD        Josphat Musapatike

## 2013-12-20 NOTE — Other (Signed)
Please clarify the severity of the patient's protein-calorie malnutrition as Mild, Moderate or Severe    The medical record reflects the following clinical findings, treatment, and risk factors:    28yo male presented w/ N/V/D. He has had multiple w/u's for similar symptoms over the last 4 years. His BMI = 21.8. Albumin 4.7, t. prot. 7.9. He is being treated w/ IV cipro and flagyl, NS @ 125, currently NPO.    Please clarify and document your clinical opinion in the progress notes and discharge summary including the definitive and/or presumptive diagnosis, (suspected or probable), related to the above clinical findings. Please include clinical findings supporting your diagnosis.    If you DECLINE this query or would like to communicate with CDMP, please utilize the "CDMP message box" at the very bottom of the Progress Note on the right.

## 2013-12-20 NOTE — Progress Notes (Signed)
Pt summary: Uneventful day shift, pt had some c/o of pain and n/v, n/v mostly after eating or moving around. Treated with Zofran and Dilaudid. Pts urine output for entire shift. IV of NS infusing at 125 ml/h, no complications.

## 2013-12-20 NOTE — Progress Notes (Signed)
Full note dictated [phone]#[id number]    A/P - Colitis - noted by CT with marked leukocytosis at presentation, likely a superimposed issue on top of a chronic problem without a clear etiology for it with evaluations before for this.  Plan - await stool studies a labs sent off, continue symptomatic course for now, continue IV hydration, no further testing at this time unless course dictates the need for it and will follow.      Rozetta NunneryKeith Tinslee Klare, M.D.

## 2013-12-20 NOTE — Other (Signed)
Bedside shift change report given to M. Wylene SimmerPollard RN (Cabin crewoncoming nurse) by Silas Sacramentoortni B Kunzie, RN   (offgoing nurse). Report included the following information SBAR, Kardex, ED Summary, Intake/Output and MAR, urine output, N/V, diarrhea.

## 2013-12-20 NOTE — Other (Signed)
[facility] [place] [address]  NPI: [id number] / Tax ID: [id number]  Patient???s ID: [id number]    DX: Colitis 558.9            H&P Notes    H&P by Berline Chough, DO at 12/19/13 1755 Version 1 of 1   Author: Berline Chough, DO Service: Internal Medicine Author Type: Physician   Filed: 12/19/13 1808 Note Time: 12/19/13 1755 Status: Signed   Editor: Berline Chough, DO (Physician)     Expand All Collapse All       Medicine History and Physical    George [name] is a 28 y.o. male who is being admitted for sepsis due to colitis  Primary Care Provider: None  Date of Admission: 12/19/2013  Assessment/Plan   1. Sepsis due to colitis'  2. Colitis, infectious v inflammatory . 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 Leukocytosis related to above  4. Protein calorie malnutrition    PLAN:  - admit to medicine  - IV hydration  - start cipro/ flagyl  - obtain stool culture, cdiff pcr, stool O &P, CRP, ESR, ANA, pANCA, HIV  - obtain records from recent hospitalization  - consult gastroenterology    HPI:   CC: nausea/ vomitign diarrhea  George [name] is a 28 y.o. male with no significant past medical history presents with acute onset of epigastric abdominal pain associated w nausea, vomiting and diarrhea. He reports his symptoms started this morning and have progressed in nature, prompting him to seek emergent care as he has not been able to keep down any po. His mother at bedside reports that since his deployment to [place] 4 years ago, he has had multiple episodes of similar symptoms, requiring hospitalization. Once he was hospitalized in [place] upon his return, and he was [facility] [place] [facility]. He had an EGD and colonoscopy at that time which she reports was " negative". Despite this he continues to lose weight despite adequate po intake.     On presentation  to the Er he was afebrile, hypotensive with out tachcyardia. He is cachectic and malnourished. Abdomin was soft, but with diffuse tenderness in his epigastryum. Labs w significant leukocytosis w left shift, elevated lactic acid. CT abdomen pelvis with diffuse mural thickening of rectum, sigmoid and left colon. Medicine is asked to admit for further management.          Past Medical History    History reviewed. No pertinent past medical history.    Past Surgical History    Past Surgical History   Procedure Laterality Date   ??? Hx cholecystectomy        History   Substance Use Topics   ??? Smoking status: Current Every Day Smoker -- 0.50 packs/day   ??? Smokeless tobacco: Not on file   ??? Alcohol Use: No   Family History    History reviewed. No pertinent family history.   No current facility-administered medications on file prior to encounter.   No current outpatient prescriptions on file prior to encounter.   No Known Allergies      Review of Systems  Constitutional: No fever, chills,+weight loss - falls  Skin: no itching or rashes  HEENT: no ear discomfort, hearing loss, tinnitus, epistaxis or sore throat  EYES: no blurry vision, double vision or photophobia  CARDIOVASCULAR: no  claudication, cp, palpitations, orthopnea, pnd or LE edema  PULMONARY: no cough, wheeze, shortness of breath or sputum production  GI: + nausea, +vomiting,+ diarrhea, +abdominal pain,- melena, hematemesis or brbpr  GU: no dysuria, hematuria  MUSCULOSKELETAL: no back pain, joint pain or myalgias  ENDOCRINE: no heat/cold intolerance, polyuria or polydipsia  HEME: no easy bruising or bleeding  NEURO: no unilateral weakness, numbness, tingling or seizures    Physical Exam:   Visit Vitals   Item Reading   ??? BP 108/64   ??? Pulse 63   ??? Temp 98.1 ??F (36.7 ??C)   ??? Resp 16   ??? Ht _0  (1.676 m)   ??? Wt 61.236 kg (135 lb)   ??? BMI 21.80 kg/m2   ??? SpO2 97%   Body mass index is 21.8 kg/(m^2).    Physical Exam:  GEN: thin  HEENT: atraumatic, hearing adequate,  nose normal,oropharynx clear, MMdry  NECK: supple, trachea midline, no supraclavicular or submandibular adenopathy noted  EYES: conjuctiva normal, lids with out lesions, PERRL  HEART: RRR with out m/r/g, pmi nondisplaced, pulses 2+ distally  LUNGS: equal chest wall expansion, cta bl with out wheezes/rales or rhonchi  AB: soft, +BS, nt/nd no organomegaly  NEURO: alert, awake and oriented x3, gait not assessed, cranial nerves intact, strength 5/5 bl UE and LE, sensation intact, reflexes nonpathological  SKIN: dry, intact, warm no breakdown noted        Laboratory Studies:    BMP:   Lab Results   Component Value Date/Time    NA 141 12/19/2013 1:25 PM    K 3.7 12/19/2013 1:25 PM    CL 105 12/19/2013 1:25 PM    CO2 22 12/19/2013 1:25 PM    AGAP 14 12/19/2013 1:25 PM    GLU 143* 12/19/2013 1:25 PM    BUN 17 12/19/2013 1:25 PM    CREA 1.12 12/19/2013 1:25 PM    GFRAA >60 12/19/2013 1:25 PM    GFRNA >60 12/19/2013 1:25 PM   CBC:   Lab Results   Component Value Date/Time    WBC 24.5* 12/19/2013 1:25 PM    HGB 16.2* 12/19/2013 1:25 PM    HCT 45.4 12/19/2013 1:25 PM    PLT 314 12/19/2013 1:25 PM     Imaging studies personally reviewed:  CT AB/PELVIS: " 1. Diffuse mural thickening of the rectum, sigmoid colon, and left colon  suggesting an infectious/inflammatory colitis. Clinical correlation is  recommended.  2. Previous cholecystectomy."    Berline Chough, DO  Internal Medicine/Geriatrics

## 2013-12-21 LAB — ANTI-NEUTROPHIL CYTOPLASMIC AB
ATYPICAL PANCA: <1:20 {titer}
Atypical pANCA: <1:20 {titer}
Cytoplasmic (C-ANCA) Ab: <1:20 {titer}
Cytoplasmic (C-ANCA) Ab: <1:20 {titer}
PERINUCLEAR (P-ANCA), 162401: <1:20 {titer}
Perinuclear (P-ANCA): <1:20 {titer}

## 2013-12-21 LAB — ANA BY MULTIPLEX FLOW IA, QL
ANA, Direct: NEGATIVE
ANA: NEGATIVE

## 2013-12-21 LAB — CBC WITH AUTOMATED DIFF
ABS. BASOPHILS: 0 10*3/uL (ref 0.0–0.06)
ABS. EOSINOPHILS: 0.1 10*3/uL (ref 0.0–0.4)
ABS. LYMPHOCYTES: 2 10*3/uL (ref 0.9–3.6)
ABS. MONOCYTES: 1.4 10*3/uL — ABNORMAL HIGH (ref 0.05–1.2)
ABS. NEUTROPHILS: 2.8 10*3/uL (ref 1.8–8.0)
BASOPHILS: 0 % (ref 0–2)
EOSINOPHILS: 2 % (ref 0–5)
HCT: 35.8 % — ABNORMAL LOW (ref 36.0–48.0)
HGB: 12.3 g/dL — ABNORMAL LOW (ref 13.0–16.0)
LYMPHOCYTES: 32 % (ref 21–52)
MCH: 29.2 PG (ref 24.0–34.0)
MCHC: 34.4 g/dL (ref 31.0–37.0)
MCV: 85 FL (ref 74.0–97.0)
MONOCYTES: 22 % — ABNORMAL HIGH (ref 3–10)
MPV: 9.6 FL (ref 9.2–11.8)
NEUTROPHILS: 44 % (ref 40–73)
PLATELET: 189 10*3/uL (ref 135–420)
RBC: 4.21 M/uL — ABNORMAL LOW (ref 4.70–5.50)
RDW: 13.4 % (ref 11.6–14.5)
WBC: 6.3 10*3/uL (ref 4.6–13.2)

## 2013-12-21 LAB — METABOLIC PANEL, BASIC
Anion gap: 9 mmol/L (ref 3.0–18)
BUN/Creatinine ratio: 9 — ABNORMAL LOW (ref 12–20)
BUN: 9 MG/DL (ref 7.0–18)
CO2: 26 mmol/L (ref 21–32)
Calcium: 8.3 MG/DL — ABNORMAL LOW (ref 8.5–10.1)
Chloride: 102 mmol/L (ref 100–108)
Creatinine: 0.95 MG/DL (ref 0.6–1.3)
GFR est AA: >60 mL/min/{1.73_m2} (ref >60)
GFR est non-AA: >60 mL/min/{1.73_m2} (ref >60)
Glucose: 84 mg/dL (ref 74–99)
Potassium: 3.5 mmol/L (ref 3.5–5.5)
Sodium: 137 mmol/L (ref 136–145)

## 2013-12-21 MED ADMIN — ondansetron (ZOFRAN) injection 4 mg: INTRAVENOUS | @ 11:00:00 | NDC 23155037831

## 2013-12-21 MED ADMIN — HYDROmorphone (PF) (DILAUDID) injection 1 mg: INTRAVENOUS | @ 11:00:00 | NDC 00409128331

## 2013-12-21 MED ADMIN — HYDROmorphone (PF) (DILAUDID) injection 1 mg: INTRAVENOUS | @ 22:00:00 | NDC 00409128331

## 2013-12-21 MED ADMIN — HYDROmorphone (PF) (DILAUDID) injection 1 mg: INTRAVENOUS | @ 19:00:00 | NDC 00409128331

## 2013-12-21 MED ADMIN — ondansetron (ZOFRAN) injection 4 mg: INTRAVENOUS | @ 22:00:00 | NDC 23155037831

## 2013-12-21 MED ADMIN — dextrose 5% and 0.9% NaCl infusion: INTRAVENOUS | @ 15:00:00 | NDC 00409794109

## 2013-12-21 MED ADMIN — HYDROmorphone (PF) (DILAUDID) injection 1 mg: INTRAVENOUS | @ 06:00:00 | NDC 00409128331

## 2013-12-21 MED ADMIN — zolpidem (AMBIEN) tablet 10 mg: ORAL | @ 04:00:00 | NDC 68084018911

## 2013-12-21 MED ADMIN — ondansetron (ZOFRAN) injection 4 mg: INTRAVENOUS | @ 16:00:00 | NDC 23155037831

## 2013-12-21 MED ADMIN — ondansetron (ZOFRAN) injection 4 mg: INTRAVENOUS | @ 06:00:00 | NDC 23155037831

## 2013-12-21 MED ADMIN — ondansetron (ZOFRAN) injection 4 mg: INTRAVENOUS | @ 02:00:00 | NDC 23155037831

## 2013-12-21 MED ADMIN — loperamide (IMODIUM) capsule 2 mg: ORAL | @ 22:00:00 | NDC 51079069001

## 2013-12-21 MED ADMIN — HYDROmorphone (PF) (DILAUDID) injection 1 mg: INTRAVENOUS | @ 15:00:00 | NDC 00409128331

## 2013-12-21 MED ADMIN — loperamide (IMODIUM) capsule 2 mg: ORAL | @ 18:00:00 | NDC 51079069001

## 2013-12-21 MED ADMIN — HYDROmorphone (PF) (DILAUDID) injection 1 mg: INTRAVENOUS | @ 02:00:00 | NDC 00409128331

## 2013-12-21 MED FILL — ONDANSETRON (PF) 4 MG/2 ML INJECTION: 4 mg/2 mL | INTRAMUSCULAR | Qty: 2

## 2013-12-21 MED FILL — LOPERAMIDE 2 MG CAP: 2 mg | ORAL | Qty: 1

## 2013-12-21 MED FILL — HYDROMORPHONE (PF) 1 MG/ML IJ SOLN: 1 mg/mL | INTRAMUSCULAR | Qty: 1

## 2013-12-21 MED FILL — ZOLPIDEM 5 MG TAB: 5 mg | ORAL | Qty: 2

## 2013-12-21 MED FILL — DEXTROSE 5% IN NORMAL SALINE IV: INTRAVENOUS | Qty: 1000

## 2013-12-21 NOTE — Progress Notes (Signed)
NUTRITION    ASSESSMENT:     Diet:  Clear Liquids  Food Allergies:  NKFA  PO Intake:  _0   Good     _1   Fair     _2   Poor     _3   N/A     No data found.      _4  No cultural, religious, or ethnic dietary needs identified   _5  Cultural, religious and ethnic food preferences identified and addressed      Skin:   _6    Reviewed    Medications:  _7    Reviewed; lovenox, D5-NS at 125 ml/hr (provides 150 g dextrose=510 kcals/d)    Labs:    _8    Reviewed   Lab Results   Component Value Date/Time    Sodium 137 12/21/2013  3:50 AM    Potassium 3.5 12/21/2013  3:50 AM    Chloride 102 12/21/2013  3:50 AM    CO2 26 12/21/2013  3:50 AM    Anion gap 9 12/21/2013  3:50 AM    Glucose 84 12/21/2013  3:50 AM    BUN 9 12/21/2013  3:50 AM    Creatinine 0.95 12/21/2013  3:50 AM    Calcium 8.3 12/21/2013  3:50 AM    Magnesium 1.7 12/19/2013  1:25 PM    Phosphorus 1.9 12/19/2013  1:25 PM    Albumin 4.7 12/19/2013  1:25 PM       Anthropometrics:  IBW : 64.411 kg (142 lb) (128-156#)  BMI (calculated): 20.7    Last 3 Recorded Weights in this Encounter    12/20/13 0019 12/20/13 2350 12/21/13 1017   Weight: 56.427 kg (124 lb 6.4 oz) 57.97 kg (127 lb 12.8 oz) 57.97 kg (127 lb 12.8 oz)      Ht Readings from Last 1 Encounters:   12/21/13 5' 6" (1.676 m)       Estimated Nutrition Needs:  Calories (kcal): 2050 Kcals/day (2050-2350 kcals/d (MSJ x 1.2-1.4 (PAL) + 250))  Protein (g): 78 g (78-91 g/d (1.2-1.4 g/kg IBW of 65 kg))  Fluid (ml): 2050 ml (2050-2350 kcals/d (23m/kcal))    Based on:  _9   Actual Wt  (calories and fluid)   _10   IBW  (protein)    Pt expected to meet estimated nutrient needs:  _11    Yes     _12   No    Summary:  BPA MST RISK Screening Referral received noting "no" response to question regarding recent unplanned weight loss; however, "unsure" response to question regarding amount of weight loss.    Pt presented with n/v/d, admitted with sepsis, colitis, leukocytosis, protein kcal malnutrition. Pt reports has had multiple episodes of similar  symptoms x four years. Per GI pt possibly with cyclic vomiting syndrome.    Currently on a clear liquids diet. Spoke to pt at bedside, stated he is not tolerating the clears, "I cannot keep anything down," reported has not been able to keep anything down since "this past Tuesday morning." Denied any weight loss PTA but reports gaining weight/maintainig current weight can be difficult, BMI indicates pt is WNL at this time.     PO nutrition not currently indicated d/t persistent n/v, if pt continues with inability to tolerate PO, pt would benefit from alternative means of nutrition support, pt currently receiving D5, providing a total of 510 kcals/d, will continue to monitor. Diet education not indicated at this time.    DIAGNOSIS:   1.   Inadequate oral intake R/T colitis, sepsis, decreased ability to consume  sufficient energy AEB pt report of persistent n/v, "I can't keep anything down," current clear liquids diet nutritionally inadequate to meet estimated needs (x 2 days)    Goals:  1. Pt will tolerate solid consistency diet, as diet advanced, upon f/u    INTERVENTIONS:   1. Continue current diet; encourage PO intakes, as tolerated; recommend advancing to regular, only as medically appropriate    MONITORING/EVALUATION:   1. Monitor PO  2. Monitor advancement of diet, intakes, tolerance, as diet advanced  3. If pt unable to tolerate PO, as diet advanced, pt would benefit from alternative means of nutrition support  4. Monitor weight  5. Evaluate per Nutrition Risk protocol:  _0   High     _1   Moderate     _2   Minimal/Uncompromised    Previous Recommendations:  _3   Implemented     _4   Not Implemented (RD to address)     <[id number]>_8<\/SXQKSKSHNGITJLLV>_7  Not applicable   Previous Goal:  _6   Met     _7   Not Met (RD to address)     <[id number]>_9<\/[id number]>_2   Not applicable     Discharge Planning:  Further nutrition recommendations, pending advancement of diet, ability of pt to tolerate regular consistency diet    Ernestina Patches, RD

## 2013-12-21 NOTE — Other (Signed)
Bedside and Verbal shift change report given to M Pollard RN (oncoming nurse) by Naomi S McGeorge, RN   (offgoing nurse). Report included the following information SBAR and MAR.

## 2013-12-21 NOTE — Progress Notes (Signed)
Gastrointestinal Progress Note    Patient Name: George [name]    JXBJY'NToday's Date: 12/21/2013    Admit Date: 12/19/2013    Subjective:     Diet: Patient is on Clear Liquid and is not tolerating.    Nausea is present. Vomiting is present.    Pain: Patient does not complain of abdominal pain.     Bowel Movements: Diarrhea    Bleeding:  None    Current Facility-Administered Medications   Medication Dose Route Frequency   ??? zolpidem (AMBIEN) tablet 10 mg  10 mg Oral QHS PRN   ??? scopolamine (TRANSDERM-SCOP) 1.5 mg  1.5 mg TransDERmal NOW   ??? 0.9% sodium chloride infusion  125 mL/hr IntraVENous CONTINUOUS   ??? ondansetron (ZOFRAN) injection 4 mg  4 mg IntraVENous Q4H PRN   ??? prochlorperazine (COMPAZINE) suppository 25 mg  25 mg Rectal Q12H PRN   ??? acetaminophen (TYLENOL) suppository 650 mg  650 mg Rectal Q4H PRN   ??? acetaminophen (TYLENOL) tablet 650 mg  650 mg Oral Q4H PRN   ??? enoxaparin (LOVENOX) injection 40 mg  40 mg SubCUTAneous Q24H   ??? HYDROmorphone (PF) (DILAUDID) injection 1 mg  1 mg IntraVENous Q3H PRN          Objective:     Visit Vitals   Item Reading   ??? BP 112/66   ??? Pulse 75   ??? Temp 97.9 ??F (36.6 ??C)   ??? Resp 16   ??? Ht 5\' 6"  (1.676 m)   ??? Wt 57.97 kg (127 lb 12.8 oz)   ??? BMI 20.64 kg/m2   ??? SpO2 98%       02/17 1900 - 02/19 0659  In: 4210 [I.V.:4210]  Out: 1755 [Urine:1755]    Lungs: clear to auscultation bilaterally  Heart: regular rate and rhythm, S1, S2 normal, no murmur  Abdomen: soft, non-tender, non-distended, bowel sounds normal    Data Review:    Labs: Results:       Chemistry Recent Labs      12/21/13   0350  12/20/13   0420  12/19/13   1325   GLU  84  95  143*   NA  137  141  141   K  3.5  3.3*  3.7   CL  102  107  105   CO2  26  23  22    BUN  9  11  17    CREA  0.95  0.96  1.12   CA  8.3*  8.2*  9.5   AGAP  9  11  14    BUCR  9*  11*  15   AP   --    --   70   TP   --    --   7.9   ALB   --    --   4.7   GLOB   --    --   3.2   AGRAT   --    --   1.5      CBC w/Diff Recent Labs      12/21/13   0350   12/20/13   0420  12/19/13   1325   WBC  6.3  13.3*  24.5*   RBC  4.21*  4.40*  5.40   HGB  12.3*  13.0  16.2*   HCT  35.8*  37.0  45.4   PLT  189  215  314   GRANS  44  88*  90*   LYMPH  32  6*  4*   EOS  2  0  0      Coagulation No results for input(s): PTP, INR, APTT in the last 72 hours.    Liver Enzymes Recent Labs      12/19/13   1325   TP  7.9   ALB  4.7   AP  70   SGOT  18          Assessment:     Active Problems:    Colitis (12/19/2013)        Plan:     Colitis - still with significant diarrhea but pain better, C Diff negative. Plan - imodium as needed, change IV to D5 NS, await pending results.    Chronic symptoms - seems suggestive of Cyclic Vomiting Syndrome as likely possibility. Plan - consider management options for this with PMD after discharge.          Raymon Mutton, MD  December 21, 2013

## 2013-12-21 NOTE — Progress Notes (Signed)
Chart reviewed, met with patient who stated that: he lived with his mother and would return home at discharge; he was independent with ADLs and used no assistive devices; his PCP was Dr Mertie Clause.  No discharge needs anticipated.  CMgr will be available should discharge needs arise.

## 2013-12-21 NOTE — Progress Notes (Signed)
Chaplain conducted an initial consultation and Spiritual Assessment for George [name], who is a 28 y.o.,male. Patient???s Primary Language is: [place].   According to the patient???s EMR Religious Affiliation is: No religion.     The reason the Patient came to the hospital is:   Patient Active Problem List    Diagnosis Date Noted   ??? Colitis 12/19/2013        The Chaplain provided the following Interventions:  Initiated a relationship of care and support.   Explored issues of faith, belief, spirituality and religious/ritual needs while hospitalized.  Listened empathically.  Provided chaplaincy education.  Provided information about Spiritual Care Services.  Offered prayer and assurance of continued prayers on patient's behalf.   Chart reviewed.    The following outcomes were achieved:  Patient shared limited information about both their medical narrative and spiritual journey/beliefs.  Patient processed feeling about current hospitalization.  Patient expressed gratitude for the Chaplain's visit.    Assessment:  Patient does not have any religious/cultural needs that will affect patient???s preferences in health care.  Patient did not indicate any spiritual or religious issues which require Spiritual Care Services interventions at this time.       Plan:  Chaplains will continue to follow and will provide pastoral care on an as needed/requested basis.  Chaplain recommends bedside caregivers page chaplain on duty if patient shows signs of acute spiritual or emotional distress.    Fr. Darliss RidgelAlex Shuter, M.[facility] [employer]  Chaplain  [phone]

## 2013-12-21 NOTE — Progress Notes (Signed)
Progress Note      Patient: George [name]               Sex: male          DOA: 12/19/2013       Date of Birth:  08/19/1986      Age:  [age] y.o.        LOS:  LOS: 2 days             CC: Diarrhea    Subjective:     Patient report persistent diarrhea despite Imodium. No new complaints.    Objective:      Visit Vitals   Item Reading   ??? BP 101/55   ??? Pulse 59   ??? Temp 98.2 ??F (36.8 ??C)   ??? Resp 16   ??? Ht 5\' 6"  (1.676 m)   ??? Wt 57.97 kg (127 lb 12.8 oz)   ??? BMI 20.64 kg/m2   ??? SpO2 99%       Physical Exam:  Gen:  Patient is in no distress.  No complaint  Lungs:  Clearly bilaterally.  No rales, no wheeze.  Normal effort.  Heart:  Regular Rate and Rhythm.  No murmur or gallop.  No JVD.  No edema.  Abdomen:  Soft, non tender, non distended.   Extremities:  No edema, good perfusion.  Neuro:  Awake, alert, and talking.  No confusion.        Lab/Data Reviewed:  CMP:   Lab Results   Component Value Date/Time    NA 137 12/21/2013  3:50 AM    K 3.5 12/21/2013  3:50 AM    CL 102 12/21/2013  3:50 AM    CO2 26 12/21/2013  3:50 AM    AGAP 9 12/21/2013  3:50 AM    GLU 84 12/21/2013  3:50 AM    BUN 9 12/21/2013  3:50 AM    CREA 0.95 12/21/2013  3:50 AM    GFRAA >60 12/21/2013  3:50 AM    GFRNA >60 12/21/2013  3:50 AM    CA 8.3* 12/21/2013  3:50 AM     CBC:   Lab Results   Component Value Date/Time    WBC 6.3 12/21/2013  3:50 AM    HGB 12.3* 12/21/2013  3:50 AM    HCT 35.8* 12/21/2013  3:50 AM    PLT 189 12/21/2013  3:50 AM           Assessment/Plan     Active Problems:    Colitis (12/19/2013)        Plan:  - continue cipro/ flagyl  - Imodium prn  - IV fluids  - clear liquid  - GI following; awaiting send out labs  - follow culture data

## 2013-12-21 NOTE — Progress Notes (Addendum)
Patient had uneventful night. Medicated for pain and nausea through out shift. Patient states that prn dilaudid and zofran helps to decrease discomfort. Still having watery BMs. VSS. IVFs infusing. No other concerns.     Bedside and Verbal shift change report given to Malena CatholicN. McGeorge, RN (oncoming nurse) by Cyd SilenceMeghan L Graviela Nodal, RN   (offgoing nurse). Report included the following information SBAR, Kardex and MAR.

## 2013-12-21 NOTE — Progress Notes (Addendum)
7:39 AM - Received report on pt from off-going nurse. In bed with HOB slightly elevated.c/o pain 5/10. Wanted heat adjusted. Very cold in room. No other complaints voiced.    9:48 AM in bed talking about plans    11:02 AM in bed resting. C/o pain continuing at 8/10 before and after pain admin.  12:57 PM in bed on phone. Requested immodium- Administered.  Total of 3 episodes of diarrhea

## 2013-12-22 LAB — HIV 1/2 AB SCREEN W RFLX CONFIRM
HIV 1/2 Interpretation: NONREACTIVE
HIV1/2 INTERPRETATION, HHIVI: NONREACTIVE

## 2013-12-22 LAB — METABOLIC PANEL, BASIC
Anion gap: 7 mmol/L (ref 3.0–18)
BUN/Creatinine ratio: 6 — ABNORMAL LOW (ref 12–20)
BUN: 6 MG/DL — ABNORMAL LOW (ref 7.0–18)
CO2: 29 mmol/L (ref 21–32)
Calcium: 8.5 MG/DL (ref 8.5–10.1)
Chloride: 104 mmol/L (ref 100–108)
Creatinine: 0.95 MG/DL (ref 0.6–1.3)
GFR est AA: >60 mL/min/{1.73_m2} (ref >60)
GFR est non-AA: >60 mL/min/{1.73_m2} (ref >60)
Glucose: 102 mg/dL — ABNORMAL HIGH (ref 74–99)
Potassium: 3.4 mmol/L — ABNORMAL LOW (ref 3.5–5.5)
Sodium: 140 mmol/L (ref 136–145)

## 2013-12-22 LAB — CBC WITH AUTOMATED DIFF
ABS. BASOPHILS: 0 10*3/uL (ref 0.0–0.06)
ABS. EOSINOPHILS: 0.1 10*3/uL (ref 0.0–0.4)
ABS. LYMPHOCYTES: 2.2 10*3/uL (ref 0.9–3.6)
ABS. MONOCYTES: 1 10*3/uL (ref 0.05–1.2)
ABS. NEUTROPHILS: 2.3 10*3/uL (ref 1.8–8.0)
BASOPHILS: 0 % (ref 0–2)
EOSINOPHILS: 2 % (ref 0–5)
HCT: 37.4 % (ref 36.0–48.0)
HGB: 13.2 g/dL (ref 13.0–16.0)
LYMPHOCYTES: 39 % (ref 21–52)
MCH: 29.1 PG (ref 24.0–34.0)
MCHC: 35.3 g/dL (ref 31.0–37.0)
MCV: 82.6 FL (ref 74.0–97.0)
MONOCYTES: 17 % — ABNORMAL HIGH (ref 3–10)
MPV: 9.7 FL (ref 9.2–11.8)
NEUTROPHILS: 42 % (ref 40–73)
PLATELET: 206 10*3/uL (ref 135–420)
RBC: 4.53 M/uL — ABNORMAL LOW (ref 4.70–5.50)
RDW: 12.9 % (ref 11.6–14.5)
WBC: 5.5 10*3/uL (ref 4.6–13.2)

## 2013-12-22 LAB — CULTURE, STOOL

## 2013-12-22 LAB — OVA & PARASITES, STOOL

## 2013-12-22 LAB — OVA & PARASITES, STOOL, REFLEX

## 2013-12-22 MED ADMIN — zolpidem (AMBIEN) tablet 10 mg: ORAL | @ 04:00:00 | NDC 68084018911

## 2013-12-22 MED ADMIN — HYDROmorphone (PF) (DILAUDID) injection 1 mg: INTRAVENOUS | @ 19:00:00 | NDC 00409128331

## 2013-12-22 MED ADMIN — HYDROmorphone (PF) (DILAUDID) injection 1 mg: INTRAVENOUS | @ 15:00:00 | NDC 00409128331

## 2013-12-22 MED ADMIN — loperamide (IMODIUM) capsule 2 mg: ORAL | @ 18:00:00 | NDC 51079069001

## 2013-12-22 MED ADMIN — dextrose 5% and 0.9% NaCl infusion: INTRAVENOUS | @ 18:00:00 | NDC 00409794109

## 2013-12-22 MED ADMIN — loperamide (IMODIUM) capsule 2 mg: ORAL | @ 22:00:00 | NDC 51079069001

## 2013-12-22 MED ADMIN — potassium chloride (K-DUR, KLOR-CON) SR tablet 40 mEq: ORAL | @ 22:00:00 | NDC 68084036011

## 2013-12-22 MED ADMIN — ondansetron (ZOFRAN) injection 4 mg: INTRAVENOUS | @ 15:00:00 | NDC 23155037831

## 2013-12-22 MED ADMIN — HYDROmorphone (PF) (DILAUDID) injection 1 mg: INTRAVENOUS | @ 04:00:00 | NDC 00409128331

## 2013-12-22 MED ADMIN — HYDROmorphone (PF) (DILAUDID) injection 1 mg: INTRAVENOUS | @ 07:00:00 | NDC 00409128331

## 2013-12-22 MED ADMIN — HYDROmorphone (PF) (DILAUDID) injection 1 mg: INTRAVENOUS | @ 22:00:00 | NDC 00409128331

## 2013-12-22 MED ADMIN — ondansetron (ZOFRAN) injection 4 mg: INTRAVENOUS | @ 22:00:00 | NDC 23155037831

## 2013-12-22 MED ADMIN — ondansetron (ZOFRAN) injection 4 mg: INTRAVENOUS | @ 04:00:00 | NDC 23155037831

## 2013-12-22 MED ADMIN — enoxaparin (LOVENOX) injection 40 mg: SUBCUTANEOUS | NDC 00075062040

## 2013-12-22 MED ADMIN — dextrose 5% and 0.9% NaCl infusion: INTRAVENOUS | @ 01:00:00 | NDC 00409794109

## 2013-12-22 MED FILL — LOVENOX 40 MG/0.4 ML SUBCUTANEOUS SYRINGE: 40 mg/0.4 mL | SUBCUTANEOUS | Qty: 0.4

## 2013-12-22 MED FILL — ONDANSETRON (PF) 4 MG/2 ML INJECTION: 4 mg/2 mL | INTRAMUSCULAR | Qty: 2

## 2013-12-22 MED FILL — KLOR-CON M20 MEQ TABLET,EXTENDED RELEASE: 20 mEq | ORAL | Qty: 2

## 2013-12-22 MED FILL — LOPERAMIDE 2 MG CAP: 2 mg | ORAL | Qty: 1

## 2013-12-22 MED FILL — ZOLPIDEM 5 MG TAB: 5 mg | ORAL | Qty: 2

## 2013-12-22 MED FILL — HYDROMORPHONE (PF) 1 MG/ML IJ SOLN: 1 mg/mL | INTRAMUSCULAR | Qty: 1

## 2013-12-22 MED FILL — TRANSDERM-SCOP 1 MG OVER 3 DAYS TRANSDERMAL PATCH: 1 mg over 3 days | TRANSDERMAL | Qty: 1

## 2013-12-22 NOTE — Progress Notes (Signed)
Gastrointestinal Progress Note    Patient Name: George [name]    RUEAV'W Date: 12/22/2013    Admit Date: 12/19/2013    Subjective:     Diet: Patient is on Clear Liquid and is not tolerating.    Nausea is not present. Vomiting is not present.    Pain: Patient does complain of abdominal pain in the epigastric region.     Bowel Movements: Diarrhea    Bleeding:  None    Current Facility-Administered Medications   Medication Dose Route Frequency   ??? dextrose 5% and 0.9% NaCl infusion  125 mL/hr IntraVENous CONTINUOUS   ??? loperamide (IMODIUM) capsule 2 mg  2 mg Oral Q4H PRN   ??? zolpidem (AMBIEN) tablet 10 mg  10 mg Oral QHS PRN   ??? scopolamine (TRANSDERM-SCOP) 1.5 mg  1.5 mg TransDERmal NOW   ??? ondansetron (ZOFRAN) injection 4 mg  4 mg IntraVENous Q4H PRN   ??? prochlorperazine (COMPAZINE) suppository 25 mg  25 mg Rectal Q12H PRN   ??? acetaminophen (TYLENOL) suppository 650 mg  650 mg Rectal Q4H PRN   ??? acetaminophen (TYLENOL) tablet 650 mg  650 mg Oral Q4H PRN   ??? enoxaparin (LOVENOX) injection 40 mg  40 mg SubCUTAneous Q24H   ??? HYDROmorphone (PF) (DILAUDID) injection 1 mg  1 mg IntraVENous Q3H PRN          Objective:     Visit Vitals   Item Reading   ??? BP 107/58   ??? Pulse 61   ??? Temp 97.8 ??F (36.6 ??C)   ??? Resp 18   ??? Ht 5\' 6"  (1.676 m)   ??? Wt 55.611 kg (122 lb 9.6 oz)   ??? BMI 19.80 kg/m2   ??? SpO2 97%       02/18 1900 - 02/20 0659  In: 6013.8 [P.O.:1560; I.V.:4453.8]  Out: 2375 [Urine:2375]    Lungs: clear to auscultation bilaterally  Heart: regular rate and rhythm, S1, S2 normal, no murmur  Abdomen: soft, epigastric tenderness, non-distended, bowel sounds normal    Data Review:    Labs: Results:       Chemistry Recent Labs      12/22/13   0430  12/21/13   0350  12/20/13   0420  12/19/13   1325   GLU  102*  84  95  143*   NA  140  137  141  141   K  3.4*  3.5  3.3*  3.7   CL  104  102  107  105   CO2  29  26  23  22    BUN  6*  9  11  17    CREA  0.95  0.95  0.96  1.12   CA  8.5  8.3*  8.2*  9.5   AGAP  7  9  11  14    BUCR   6*  9*  11*  15   AP   --    --    --   70   TP   --    --    --   7.9   ALB   --    --    --   4.7   GLOB   --    --    --   3.2   AGRAT   --    --    --   1.5      CBC w/Diff Recent Labs  12/22/13   0430  12/21/13   0350  12/20/13   0420   WBC  5.5  6.3  13.3*   RBC  4.53*  4.21*  4.40*   HGB  13.2  12.3*  13.0   HCT  37.4  35.8*  37.0   PLT  206  189  215   GRANS  42  44  88*   LYMPH  39  32  6*   EOS  2  2  0      Coagulation No results for input(s): PTP, INR, APTT in the last 72 hours.    Liver Enzymes Recent Labs      12/19/13   1325   TP  7.9   ALB  4.7   AP  70   SGOT  18          Assessment:     Active Problems:    Colitis (12/19/2013)        Plan:     Colitis - still with significant diarrhea but pain better, C Diff negative, stool culture negative, blood cultures negative, autoimmune labs negative. Plan - imodium as needed, change IV to D5 NS, await pending results, trial low residue diet.    Chronic symptoms - seems suggestive of Cyclic Vomiting Syndrome as likely possibility. Plan - consider management options for this with PMD after discharge.          Raymon MuttonKeith A Lyda Colcord, MD  December 22, 2013

## 2013-12-22 NOTE — Other (Signed)
Bedside and Verbal shift change report given to Chyrl CivatteJoann RN (oncoming nurse) by Milford CageNaomi S McGeorge, RN   (offgoing nurse). Report included the following information SBAR, MAR and Recent Results.

## 2013-12-22 NOTE — Progress Notes (Signed)
Patient had uneventful night. Medicated for pain and nausea routinely. Several loose BMs during shift. IVFs infusing. No acute changes.     Bedside and Verbal shift change report given to Malena CatholicN. McGeorge, RN (oncoming nurse) by Cyd SilenceMeghan L Trina Asch, RN   (offgoing nurse). Report included the following information SBAR, Kardex and MAR.

## 2013-12-22 NOTE — Progress Notes (Addendum)
Received repor on pt,. In bed. Euyyes closed. Easily aroused. No complaints voiced at this time.    8:04 AM Diet change - Menu provided    10:06 AM in bed resting. Very talkative. C/o pain in stomach, after eating    1:05 PM - In bed resting with HOB elevated. Watching tv. Visitor at bedside    1:37 PM - MD in with Pt. Wants to try on bland diet. Pt c/o being afraid to eat d/t pain experienced during lunch time    Pt in bed resting. Alert. Very talkative about job, and previous visitor.     Tolerated dinner. Consumed 100%

## 2013-12-22 NOTE — Progress Notes (Signed)
Progress Note      Patient: George [name]               Sex: male          DOA: 12/19/2013       Date of Birth:  03/08/1986      Age:  28 y.o.        LOS:  LOS: 3 days             CC: Diarrhea    Subjective:     Patient improved diarrhea with Imodium. Not yet tolerating po intake.    Objective:      Visit Vitals   Item Reading   ??? BP 110/64   ??? Pulse 93   ??? Temp 98 ??F (36.7 ??C)   ??? Resp 18   ??? Ht 5\' 6"  (1.676 m)   ??? Wt 55.611 kg (122 lb 9.6 oz)   ??? BMI 19.80 kg/m2   ??? SpO2 100%       Physical Exam:  Gen:  Patient is in no distress.  No complaint  Lungs:  Clearly bilaterally.  No rales, no wheeze.  Normal effort.  Heart:  Regular Rate and Rhythm.  No murmur or gallop.  No JVD.  No edema.  Abdomen:  Soft, non tender, non distended.   Extremities:  No edema, good perfusion.  Neuro:  Awake, alert, and talking.  No confusion.        Lab/Data Reviewed:  CMP:   Lab Results   Component Value Date/Time    NA 140 12/22/2013  4:30 AM    K 3.4* 12/22/2013  4:30 AM    CL 104 12/22/2013  4:30 AM    CO2 29 12/22/2013  4:30 AM    AGAP 7 12/22/2013  4:30 AM    GLU 102* 12/22/2013  4:30 AM    BUN 6* 12/22/2013  4:30 AM    CREA 0.95 12/22/2013  4:30 AM    GFRAA >60 12/22/2013  4:30 AM    GFRNA >60 12/22/2013  4:30 AM    CA 8.5 12/22/2013  4:30 AM     CBC:   Lab Results   Component Value Date/Time    WBC 5.5 12/22/2013  4:30 AM    HGB 13.2 12/22/2013  4:30 AM    HCT 37.4 12/22/2013  4:30 AM    PLT 206 12/22/2013  4:30 AM           Assessment/Plan     Active Problems:    Colitis (12/19/2013)        Plan:  - continue cipro/ flagyl  - Imodium prn  - IV fluids  - progress diet; bland foods  - GI following; negative lab w/u  - following culture data; stool cultures, O&P negative

## 2013-12-22 NOTE — Progress Notes (Signed)
NUTRITION    ASSESSMENT:     Diet:  Regular, 50 gm fat, low fiber  Food Allergies:  NKFA  PO Intake:  []   Good     []   Fair     []   Poor     [x]   N/A-diet just advanced, awaiting first meal tray     Patient Vitals for the past 100 hrs:   % Diet Eaten   12/21/13 2338 0 %   12/21/13 1521 75 %       [x]  No cultural, religious, or ethnic dietary needs identified   []  Cultural, religious and ethnic food preferences identified and addressed      Skin:   [x]    Reviewed    Medications:  [x]    Reviewed; lovenox, D5-NS at 125 ml/hr (provides 150 g dextrose=510 kcals/d), imodium     Labs:    [x]    Reviewed   Lab Results   Component Value Date/Time    Sodium 140 12/22/2013  4:30 AM    Potassium 3.4 12/22/2013  4:30 AM    Chloride 104 12/22/2013  4:30 AM    CO2 29 12/22/2013  4:30 AM    Anion gap 7 12/22/2013  4:30 AM    Glucose 102 12/22/2013  4:30 AM    BUN 6 12/22/2013  4:30 AM    Creatinine 0.95 12/22/2013  4:30 AM    Calcium 8.5 12/22/2013  4:30 AM    Magnesium 1.7 12/19/2013  1:25 PM    Phosphorus 1.9 12/19/2013  1:25 PM    Albumin 4.7 12/19/2013  1:25 PM       Anthropometrics:  IBW : 64.411 kg (142 lb) (128-156#)  BMI (calculated): 20.7    Last 3 Recorded Weights in this Encounter    12/20/13 2350 12/21/13 1017 12/21/13 2337   Weight: 57.97 kg (127 lb 12.8 oz) 57.97 kg (127 lb 12.8 oz) 55.611 kg (122 lb 9.6 oz)      Ht Readings from Last 1 Encounters:   12/21/13 5' 6"  (1.676 m)       Estimated Nutrition Needs:  Calories (kcal): 2050 Kcals/day (2050-2350 kcals/d (MSJ x 1.2-1.4 (PAL) + 250))  Protein (g): 78 g (78-91 g/d (1.2-1.4 g/kg IBW of 65 kg))  Fluid (ml): 2050 ml (2050-2350 kcals/d (11m/kcal))    Based on:  [x]   Actual Wt  (calories and fluid)   [x]   IBW  (protein)    Pt expected to meet estimated nutrient needs:  [x]    Yes     []   No; with 75% or greater meal intakes    Summary:  Pt's diet just advanced to regular 50 gm fat, low fiber this am, currently awaiting first meal tray of solid foods since admit, PO nutrition  intervention not currently indicated, will continue to monitor intakes, tolerance on current diet. If pt unable to tolerate PO, pt may benefit from alternative means of nutrition support d/t persistent n/v, noted pt continues to receive D5, providing a total of 510 kcals/d. Diet education not indicated at this time.    DIAGNOSIS:   1.   Inadequate oral intake R/T colitis, sepsis, decreased ability to consume sufficient energy AEB pt report of persistent n/v, "I can't keep anything down," report of poor PO PTA    Goals:  1. Pt will tolerate solid consistency diet, as diet advanced, upon f/u    INTERVENTIONS:   1. Continue current diet; encourage PO intakes, as tolerated    MONITORING/EVALUATION:   1. Monitor  PO  2. Upon f/u, if intakes inadequate to meet estimated needs, consider ONS  3. If pt unable to tolerate PO d/t persistent n/v, pt would benefit from alternative means of nutrition support  4. Monitor weight  5. Evaluate per Nutrition Risk protocol:  [x]   High     []   Moderate     []   Minimal/Uncompromised    Previous Recommendations:  []   Implemented     []   Not Implemented (RD to address)     [x]  Not applicable   Previous Goal:  []   Met     [x]   Not Met (diet just advanced this am)     []   Not applicable     Discharge Planning:  Further nutrition recommendations, pending pt's ability to tolerate solid consistency diet    Ernestina Patches, RD

## 2013-12-23 LAB — CBC WITH AUTOMATED DIFF
ABS. BASOPHILS: 0 10*3/uL (ref 0.0–0.06)
ABS. EOSINOPHILS: 0.1 10*3/uL (ref 0.0–0.4)
ABS. LYMPHOCYTES: 1.8 10*3/uL (ref 0.9–3.6)
ABS. MONOCYTES: 0.8 10*3/uL (ref 0.05–1.2)
ABS. NEUTROPHILS: 2.7 10*3/uL (ref 1.8–8.0)
BASOPHILS: 1 % (ref 0–2)
EOSINOPHILS: 3 % (ref 0–5)
HCT: 38.2 % (ref 36.0–48.0)
HGB: 13.6 g/dL (ref 13.0–16.0)
LYMPHOCYTES: 32 % (ref 21–52)
MCH: 29.2 PG (ref 24.0–34.0)
MCHC: 35.6 g/dL (ref 31.0–37.0)
MCV: 82 FL (ref 74.0–97.0)
MONOCYTES: 15 % — ABNORMAL HIGH (ref 3–10)
MPV: 9.6 FL (ref 9.2–11.8)
NEUTROPHILS: 49 % (ref 40–73)
PLATELET: 228 10*3/uL (ref 135–420)
RBC: 4.66 M/uL — ABNORMAL LOW (ref 4.70–5.50)
RDW: 12.8 % (ref 11.6–14.5)
WBC: 5.5 10*3/uL (ref 4.6–13.2)

## 2013-12-23 LAB — METABOLIC PANEL, BASIC
Anion gap: 8 mmol/L (ref 3.0–18)
BUN/Creatinine ratio: 10 — ABNORMAL LOW (ref 12–20)
BUN: 9 MG/DL (ref 7.0–18)
CO2: 28 mmol/L (ref 21–32)
Calcium: 8.7 MG/DL (ref 8.5–10.1)
Chloride: 102 mmol/L (ref 100–108)
Creatinine: 0.93 MG/DL (ref 0.6–1.3)
GFR est AA: >60 mL/min/{1.73_m2} (ref >60)
GFR est non-AA: >60 mL/min/{1.73_m2} (ref >60)
Glucose: 96 mg/dL (ref 74–99)
Potassium: 3.5 mmol/L (ref 3.5–5.5)
Sodium: 138 mmol/L (ref 136–145)

## 2013-12-23 MED ORDER — POTASSIUM CHLORIDE SR 20 MEQ TAB, PARTICLES/CRYSTALS
20 mEq | ORAL_TABLET | Freq: Two times a day (BID) | ORAL | Status: DC
Start: 2013-12-23 — End: 2013-12-23

## 2013-12-23 MED ORDER — PROCHLORPERAZINE 25 MG RECTAL SUPPOSITORY
25 mg | Freq: Two times a day (BID) | RECTAL | Status: DC | PRN
Start: 2013-12-23 — End: 2013-12-23

## 2013-12-23 MED ORDER — HYDROCODONE-ACETAMINOPHEN 5 MG-325 MG TAB
5-325 mg | ORAL_TABLET | ORAL | Status: DC | PRN
Start: 2013-12-23 — End: 2017-07-28

## 2013-12-23 MED ORDER — POTASSIUM CHLORIDE SR 20 MEQ TAB, PARTICLES/CRYSTALS
20 mEq | ORAL_TABLET | Freq: Two times a day (BID) | ORAL | Status: AC
Start: 2013-12-23 — End: 2013-12-28

## 2013-12-23 MED ORDER — HYDROCODONE-ACETAMINOPHEN 5 MG-325 MG TAB
5-325 mg | ORAL_TABLET | ORAL | Status: DC | PRN
Start: 2013-12-23 — End: 2013-12-23

## 2013-12-23 MED ORDER — CIPROFLOXACIN 500 MG TAB
500 mg | ORAL_TABLET | Freq: Two times a day (BID) | ORAL | Status: DC
Start: 2013-12-23 — End: 2013-12-23

## 2013-12-23 MED ORDER — PROCHLORPERAZINE 25 MG RECTAL SUPPOSITORY
25 mg | Freq: Two times a day (BID) | RECTAL | Status: AC | PRN
Start: 2013-12-23 — End: 2013-12-30

## 2013-12-23 MED ORDER — LOPERAMIDE 2 MG CAP
2 mg | ORAL_CAPSULE | ORAL | Status: DC | PRN
Start: 2013-12-23 — End: 2013-12-23

## 2013-12-23 MED ORDER — METRONIDAZOLE 500 MG TAB
500 mg | ORAL_TABLET | Freq: Three times a day (TID) | ORAL | Status: AC
Start: 2013-12-23 — End: 2013-12-30

## 2013-12-23 MED ORDER — LOPERAMIDE 2 MG CAP
2 mg | ORAL_CAPSULE | ORAL | Status: AC | PRN
Start: 2013-12-23 — End: 2014-01-02

## 2013-12-23 MED ORDER — CIPROFLOXACIN 500 MG TAB
500 mg | ORAL_TABLET | Freq: Two times a day (BID) | ORAL | Status: AC
Start: 2013-12-23 — End: 2013-12-30

## 2013-12-23 MED ADMIN — dextrose 5% and 0.9% NaCl infusion: INTRAVENOUS | @ 12:00:00 | NDC 00409794109

## 2013-12-23 MED ADMIN — loperamide (IMODIUM) capsule 2 mg: ORAL | @ 03:00:00 | NDC 51079069001

## 2013-12-23 MED ADMIN — HYDROmorphone (PF) (DILAUDID) injection 1 mg: INTRAVENOUS | @ 07:00:00 | NDC 00409128331

## 2013-12-23 MED ADMIN — zolpidem (AMBIEN) tablet 10 mg: ORAL | @ 03:00:00 | NDC 68084018911

## 2013-12-23 MED ADMIN — HYDROmorphone (PF) (DILAUDID) injection 1 mg: INTRAVENOUS | @ 03:00:00 | NDC 00409128331

## 2013-12-23 MED ADMIN — enoxaparin (LOVENOX) injection 40 mg: SUBCUTANEOUS | NDC 00075062040

## 2013-12-23 MED ADMIN — ondansetron (ZOFRAN) injection 4 mg: INTRAVENOUS | @ 02:00:00 | NDC 23155037831

## 2013-12-23 MED ADMIN — dextrose 5% and 0.9% NaCl infusion: INTRAVENOUS | @ 03:00:00 | NDC 00409794109

## 2013-12-23 MED FILL — HYDROMORPHONE (PF) 1 MG/ML IJ SOLN: 1 mg/mL | INTRAMUSCULAR | Qty: 1

## 2013-12-23 MED FILL — ONDANSETRON (PF) 4 MG/2 ML INJECTION: 4 mg/2 mL | INTRAMUSCULAR | Qty: 2

## 2013-12-23 MED FILL — ZOLPIDEM 5 MG TAB: 5 mg | ORAL | Qty: 2

## 2013-12-23 MED FILL — MAPAP (ACETAMINOPHEN) 325 MG TABLET: 325 mg | ORAL | Qty: 2

## 2013-12-23 MED FILL — KLOR-CON M20 MEQ TABLET,EXTENDED RELEASE: 20 mEq | ORAL | Qty: 2

## 2013-12-23 MED FILL — LOPERAMIDE 2 MG CAP: 2 mg | ORAL | Qty: 1

## 2013-12-23 NOTE — Other (Signed)
Bedside and Verbal shift change report given to J. Ordonez, RN (oncoming nurse) by Joann M Schneck   (offgoing nurse). Report included the following information SBAR, Kardex and MAR.

## 2013-12-23 NOTE — Progress Notes (Signed)
Dual AVS reviewed with D.Wallace RN.  All medications reviewed individually with patient.  Opportunities for questions and concerns provided.  Patient discharged via (mode of transport ie. Car, ambulance or air transport) car.  Patient's arm band appropriately discarded.

## 2013-12-23 NOTE — Discharge Summary (Signed)
Discharge Summary  Subjective:       Admit Date: 12/19/2013  Discharge Date:  12/23/2013, 10:22 AM    Discharging Physician: Sharion DoveJacob Olivene Cookston,DO pager [phone]  Primary Care Provider:  None    Patient ID:  George [name]  28 y.o. male  03/11/1986    Reason for Admission:  Colitis    Discharge Diagnosis:   Colitis  Sepsis due to above, resolved  Mild protein calorie malnutrition    Consultants:    GI    Hospital Course:   Reason for admission ( Admitting HPI): " George [name] is a 28 y.o. male with no significant past medical history presents with acute onset of epigastric abdominal pain associated w nausea, vomiting and diarrhea. He reports his symptoms started this morning and have progressed in nature, prompting him to seek emergent care as he has not been able to keep down any po. His mother at bedside reports that since his deployment to [place] 4 years ago, he has had multiple episodes of similar symptoms, requiring hospitalization. [place] was [place], and he was hospitalized 2 years ago [facility]. He had an EGD and colonoscopy at that time which she reports was " negative". Despite this he continues to lose weight despite adequate po intake.     On presentation to the Er he was afebrile, hypotensive with out tachcyardia. He is cachectic and malnourished. Abdomin was soft, but with diffuse tenderness in his epigastryum. Labs w significant leukocytosis w left shift, elevated lactic acid. CT abdomen pelvis with diffuse mural thickening of rectum, sigmoid and left colon. Medicine is asked to admit for further management. "    Pt was admitted to the medical service, hydrated and started on cipro/ flagyl. He had continued nausea, which eventually had improved. He is tolerating po currently, his pain is controlled and diarrhea improving. He is stable for DC home with outpatient follow up. It is recommended he see GI as an outpatient due to the chronic nature of his  symptoms and concerns for ? Cyclic vomiting.     Pertinent Imaging/ Operative procedures:   CT abdomen/pelvis: "  1. Diffuse mural thickening of the rectum, sigmoid colon, and left colon  suggesting an infectious/inflammatory colitis. Clinical correlation is  recommended.  2. Previous cholecystectomy."            Pertinent Results:    BMP:   Lab Results   Component Value Date/Time    NA 138 12/23/2013  6:25 AM    K 3.5 12/23/2013  6:25 AM    CL 102 12/23/2013  6:25 AM    CO2 28 12/23/2013  6:25 AM    AGAP 8 12/23/2013  6:25 AM    GLU 96 12/23/2013  6:25 AM    BUN 9 12/23/2013  6:25 AM    CREA 0.93 12/23/2013  6:25 AM    GFRAA >60 12/23/2013  6:25 AM    GFRNA >60 12/23/2013  6:25 AM     CBC:   Lab Results   Component Value Date/Time    WBC 5.5 12/23/2013  6:25 AM    HGB 13.6 12/23/2013  6:25 AM    HCT 38.2 12/23/2013  6:25 AM    PLT 228 12/23/2013  6:25 AM         Physical Exam on Discharge:  BP 100/52    Pulse 56    Temp(Src) 97.7 ??F (36.5 ??C)    Resp 17  Ht 5\' 6"  (1.676 m)    Wt 55.611 kg (122 lb 9.6 oz)    BMI 19.80 kg/m2      SpO2 99%   Body mass index is 19.8 kg/(m^2).    Condition at discharge: stable    Discharge Medications  Current Discharge Medication List      START taking these medications    Details   loperamide (IMODIUM) 2 mg capsule Take 1 Cap by mouth every four (4) hours as needed for Diarrhea for up to 10 days.  Qty: 30 Cap, Refills: 0      potassium chloride (K-DUR, KLOR-CON) 20 mEq tablet Take 2 Tabs by mouth two (2) times daily (with meals) for 5 days.  Qty: 20 Tab, Refills: 0      prochlorperazine (COMPAZINE) 25 mg suppository Insert 1 Suppository into rectum every twelve (12) hours as needed for Nausea for up to 7 days.  Qty: 10 Suppository, Refills: 0      HYDROcodone-acetaminophen (NORCO) 5-325 mg per tablet Take 1 Tab by mouth every four (4) hours as needed for Pain. Max Daily Amount: 6 Tabs.  Qty: 30 Tab, Refills: 0             Disposition: home   Follow up:  pcp  Restrictions: none    Diagnostic  Imaging/Labs pending at discharge: none      Esperanza Richters, DO  [facility]  Internal Medicine/Geriatrics  Pager: [phone]    Time Spent 20 min with >50% coordination of care

## 2013-12-23 NOTE — Other (Signed)
0800 patient sleeping quietly in bed, no distress noted.    0900 patient stated he needs to rest and sleep and will eat later. No complained of pain.

## 2013-12-25 LAB — CULTURE, BLOOD: Culture result:: NO GROWTH

## 2017-07-28 ENCOUNTER — Inpatient Hospital Stay
Admit: 2017-07-28 | Discharge: 2017-07-30 | Disposition: A | Discharge status: Home / Self Care | Attending: Family Medicine | Admitting: Family Medicine

## 2017-07-28 ENCOUNTER — Emergency Department: Admit: 2017-07-28

## 2017-07-28 ENCOUNTER — Emergency Department

## 2017-07-28 DIAGNOSIS — K5190 Ulcerative colitis, unspecified, without complications: Secondary | ICD-10-CM

## 2017-07-28 LAB — METABOLIC PANEL, COMPREHENSIVE
A-G Ratio: 1.4 (ref 0.8–1.7)
ALT (SGPT): 31 U/L (ref 16–61)
AST (SGOT): 22 U/L (ref 15–37)
Albumin: 4.6 g/dL (ref 3.4–5.0)
Alk. phosphatase: 72 U/L (ref 45–117)
Anion gap: 14 mmol/L (ref 3.0–18)
BUN/Creatinine ratio: 17 (ref 12–20)
BUN: 18 MG/DL (ref 7.0–18)
Bilirubin, total: 0.6 MG/DL (ref 0.2–1.0)
CO2: 23 mmol/L (ref 21–32)
Calcium: 9.5 MG/DL (ref 8.5–10.1)
Chloride: 102 mmol/L (ref 100–108)
Creatinine: 1.04 MG/DL (ref 0.6–1.3)
GFR est AA: >60 mL/min/{1.73_m2} (ref >60)
GFR est non-AA: >60 mL/min/{1.73_m2} (ref >60)
Globulin: 3.3 g/dL (ref 2.0–4.0)
Glucose: 131 mg/dL — ABNORMAL HIGH (ref 74–99)
Potassium: 3.9 mmol/L (ref 3.5–5.5)
Protein, total: 7.9 g/dL (ref 6.4–8.2)
Sodium: 139 mmol/L (ref 136–145)

## 2017-07-28 LAB — URINALYSIS W/ RFLX MICROSCOPIC
Bilirubin: NEGATIVE
Blood: NEGATIVE
Glucose: NEGATIVE mg/dL
Ketone: 15 mg/dL — AB
Leukocyte Esterase: NEGATIVE
Nitrites: NEGATIVE
Protein: NEGATIVE mg/dL
Specific gravity: 1.024 (ref 1.005–1.030)
Urobilinogen: 0.2 EU/dL (ref 0.2–1.0)
pH (UA): 5.5 (ref 5.0–8.0)

## 2017-07-28 LAB — CARDIAC PANEL,(CK, CKMB & TROPONIN)
CK - MB: <1 ng/ml (ref <3.6)
CK: 109 U/L (ref 39–308)
Troponin-I, QT: <0.02 NG/ML (ref 0.00–0.06)

## 2017-07-28 LAB — CBC WITH AUTOMATED DIFF
ABS. BASOPHILS: 0 10*3/uL (ref 0.0–0.1)
ABS. EOSINOPHILS: 0 10*3/uL (ref 0.0–0.4)
ABS. LYMPHOCYTES: 0.9 10*3/uL (ref 0.9–3.6)
ABS. MONOCYTES: 0.9 10*3/uL (ref 0.05–1.2)
ABS. NEUTROPHILS: 15.4 10*3/uL — ABNORMAL HIGH (ref 1.8–8.0)
BASOPHILS: 0 % (ref 0–2)
EOSINOPHILS: 0 % (ref 0–5)
HCT: 47.7 % (ref 36.0–48.0)
HGB: 17 g/dL — ABNORMAL HIGH (ref 13.0–16.0)
LYMPHOCYTES: 5 % — ABNORMAL LOW (ref 21–52)
MCH: 29.5 PG (ref 24.0–34.0)
MCHC: 35.6 g/dL (ref 31.0–37.0)
MCV: 82.7 FL (ref 74.0–97.0)
MONOCYTES: 5 % (ref 3–10)
MPV: 9.6 FL (ref 9.2–11.8)
NEUTROPHILS: 90 % — ABNORMAL HIGH (ref 40–73)
PLATELET: 270 10*3/uL (ref 135–420)
RBC: 5.77 M/uL — ABNORMAL HIGH (ref 4.70–5.50)
RDW: 12.8 % (ref 11.6–14.5)
WBC: 17.3 10*3/uL — ABNORMAL HIGH (ref 4.6–13.2)

## 2017-07-28 LAB — DRUG SCREEN, URINE
AMPHETAMINES: NEGATIVE
BARBITURATES: NEGATIVE
BENZODIAZEPINES: NEGATIVE
COCAINE: NEGATIVE
METHADONE: NEGATIVE
OPIATES: POSITIVE — AB
PCP(PHENCYCLIDINE): NEGATIVE
THC (TH-CANNABINOL): POSITIVE — AB

## 2017-07-28 LAB — MAGNESIUM: Magnesium: 1.7 mg/dL (ref 1.6–2.6)

## 2017-07-28 LAB — LACTIC ACID: Lactic acid: 1.1 MMOL/L (ref 0.4–2.0)

## 2017-07-28 LAB — LIPASE: Lipase: 93 U/L (ref 73–393)

## 2017-07-28 LAB — ETHYL ALCOHOL: ALCOHOL(ETHYL),SERUM: <3 MG/DL (ref 0–3)

## 2017-07-28 MED ORDER — ONDANSETRON (PF) 4 MG/2 ML INJECTION
4 mg/2 mL | INTRAMUSCULAR | Status: AC
Start: 2017-07-28 — End: 2017-07-28
  Administered 2017-07-28: 20:00:00 via INTRAVENOUS

## 2017-07-28 MED ORDER — LACTATED RINGERS BOLUS IV
Freq: Once | INTRAVENOUS | Status: AC
Start: 2017-07-28 — End: 2017-07-28
  Administered 2017-07-28: 20:00:00 via INTRAVENOUS

## 2017-07-28 MED ORDER — SODIUM CHLORIDE 0.9 % IV
INTRAVENOUS | Status: DC
Start: 2017-07-28 — End: 2017-07-30
  Administered 2017-07-29 (×4): via INTRAVENOUS

## 2017-07-28 MED ORDER — DICYCLOMINE 10 MG/ML IM SOLN
10 mg/mL | Freq: Once | INTRAMUSCULAR | Status: AC
Start: 2017-07-28 — End: 2017-07-28
  Administered 2017-07-28: 20:00:00 via INTRAMUSCULAR

## 2017-07-28 MED ORDER — METHYLPREDNISOLONE (PF) 125 MG/2 ML IJ SOLR
125 mg/2 mL | Freq: Three times a day (TID) | INTRAMUSCULAR | Status: DC
Start: 2017-07-28 — End: 2017-07-28

## 2017-07-28 MED ORDER — IOPAMIDOL 61 % IV SOLN
300 mg iodine /mL (61 %) | Freq: Once | INTRAVENOUS | Status: AC
Start: 2017-07-28 — End: 2017-07-28
  Administered 2017-07-28: 21:00:00 via INTRAVENOUS

## 2017-07-28 MED ORDER — ONDANSETRON (PF) 4 MG/2 ML INJECTION
4 mg/2 mL | Freq: Four times a day (QID) | INTRAMUSCULAR | Status: DC | PRN
Start: 2017-07-28 — End: 2017-07-30

## 2017-07-28 MED ORDER — METOCLOPRAMIDE 5 MG/ML IJ SOLN
5 mg/mL | INTRAMUSCULAR | Status: AC
Start: 2017-07-28 — End: 2017-07-28
  Administered 2017-07-28: 22:00:00 via INTRAVENOUS

## 2017-07-28 MED ORDER — PANTOPRAZOLE 40 MG IV SOLR
40 mg | INTRAVENOUS | Status: AC
Start: 2017-07-28 — End: 2017-07-28
  Administered 2017-07-28: 20:00:00 via INTRAVENOUS

## 2017-07-28 MED ORDER — SODIUM CHLORIDE 0.9% BOLUS IV
0.9 % | Freq: Once | INTRAVENOUS | Status: AC
Start: 2017-07-28 — End: 2017-07-29
  Administered 2017-07-28: 22:00:00 via INTRAVENOUS

## 2017-07-28 MED ORDER — SODIUM CHLORIDE 0.9 % IV PIGGY BACK
4.5 gram | INTRAVENOUS | Status: AC
Start: 2017-07-28 — End: 2017-07-29
  Administered 2017-07-28: 22:00:00 via INTRAVENOUS

## 2017-07-28 MED ORDER — METHYLPREDNISOLONE (PF) 40 MG/ML IJ SOLR
40 mg/mL | Freq: Three times a day (TID) | INTRAMUSCULAR | Status: DC
Start: 2017-07-28 — End: 2017-07-29
  Administered 2017-07-29 (×2): via INTRAVENOUS

## 2017-07-28 MED ORDER — MORPHINE 4 MG/ML INTRAVENOUS SOLUTION
4 mg/mL | INTRAVENOUS | Status: AC
Start: 2017-07-28 — End: 2017-07-28
  Administered 2017-07-28: 21:00:00 via INTRAVENOUS

## 2017-07-28 MED ORDER — PANTOPRAZOLE 40 MG TAB, DELAYED RELEASE
40 mg | Freq: Two times a day (BID) | ORAL | Status: DC
Start: 2017-07-28 — End: 2017-07-30
  Administered 2017-07-29 – 2017-07-30 (×4): via ORAL

## 2017-07-28 MED ORDER — METRONIDAZOLE IN SODIUM CHLORIDE (ISO-OSM) 500 MG/100 ML IV PIGGY BACK
500 mg/100 mL | Freq: Three times a day (TID) | INTRAVENOUS | Status: DC
Start: 2017-07-28 — End: 2017-07-30
  Administered 2017-07-29 – 2017-07-30 (×5): via INTRAVENOUS

## 2017-07-28 MED ORDER — PROMETHAZINE 25 MG/ML INJECTION
25 mg/mL | INTRAMUSCULAR | Status: AC
Start: 2017-07-28 — End: 2017-07-28
  Administered 2017-07-28: 21:00:00 via INTRAVENOUS

## 2017-07-28 MED ORDER — CIPROFLOXACIN IN D5W 400 MG/200 ML IV PIGGY BACK
400 mg/200 mL | Freq: Two times a day (BID) | INTRAVENOUS | Status: DC
Start: 2017-07-28 — End: 2017-07-30
  Administered 2017-07-29 – 2017-07-30 (×4): via INTRAVENOUS

## 2017-07-28 MED ORDER — SODIUM CHLORIDE 0.9 % IV
25 mg/mL | Freq: Four times a day (QID) | INTRAVENOUS | Status: DC | PRN
Start: 2017-07-28 — End: 2017-07-30

## 2017-07-28 MED ORDER — METHYLPREDNISOLONE (PF) 125 MG/2 ML IJ SOLR
125 mg/2 mL | Freq: Once | INTRAMUSCULAR | Status: AC
Start: 2017-07-28 — End: 2017-07-28
  Administered 2017-07-28: 22:00:00 via INTRAVENOUS

## 2017-07-28 MED ORDER — MORPHINE 4 MG/ML INTRAVENOUS SOLUTION
4 mg/mL | INTRAVENOUS | Status: AC
Start: 2017-07-28 — End: 2017-07-28
  Administered 2017-07-28: 20:00:00 via INTRAVENOUS

## 2017-07-28 MED ORDER — MORPHINE 2 MG/ML INJECTION
2 mg/mL | INTRAMUSCULAR | Status: DC | PRN
Start: 2017-07-28 — End: 2017-07-30
  Administered 2017-07-29 – 2017-07-30 (×4): via INTRAVENOUS

## 2017-07-28 MED FILL — ISOVUE-300  61 % INTRAVENOUS SOLUTION: 300 mg iodine /mL (61 %) | INTRAVENOUS | Qty: 100

## 2017-07-28 MED FILL — LACTATED RINGERS IV: INTRAVENOUS | Qty: 1000

## 2017-07-28 MED FILL — PROTONIX 40 MG INTRAVENOUS SOLUTION: 40 mg | INTRAVENOUS | Qty: 40

## 2017-07-28 MED FILL — PROMETHAZINE 25 MG/ML INJECTION: 25 mg/mL | INTRAMUSCULAR | Qty: 0.5

## 2017-07-28 MED FILL — SOLU-MEDROL (PF) 125 MG/2 ML SOLUTION FOR INJECTION: 125 mg/2 mL | INTRAMUSCULAR | Qty: 2

## 2017-07-28 MED FILL — ONDANSETRON (PF) 4 MG/2 ML INJECTION: 4 mg/2 mL | INTRAMUSCULAR | Qty: 4

## 2017-07-28 MED FILL — METOCLOPRAMIDE 5 MG/ML IJ SOLN: 5 mg/mL | INTRAMUSCULAR | Qty: 2

## 2017-07-28 MED FILL — BENTYL 10 MG/ML INTRAMUSCULAR SOLUTION: 10 mg/mL | INTRAMUSCULAR | Qty: 2

## 2017-07-28 MED FILL — PROMETHAZINE 25 MG/ML INJECTION: 25 mg/mL | INTRAMUSCULAR | Qty: 1

## 2017-07-28 MED FILL — SODIUM CHLORIDE 0.9 % IV: INTRAVENOUS | Qty: 1000

## 2017-07-28 MED FILL — PIPERACILLIN-TAZOBACTAM 4.5 GRAM IV SOLR: 4.5 gram | INTRAVENOUS | Qty: 4.5

## 2017-07-28 MED FILL — MORPHINE 4 MG/ML INTRAVENOUS SOLUTION: 4 mg/mL | INTRAVENOUS | Qty: 1

## 2017-07-28 NOTE — ED Triage Notes (Signed)
Patient reports he has had vomiting and abdominal pain since approximately 6am.

## 2017-07-28 NOTE — ED Notes (Signed)
Went to give morphine for pain, but patient in CT scan, will administer medication when patient returns from CT

## 2017-07-28 NOTE — Other (Signed)
TRANSFER - OUT REPORT:    Verbal report given to [name] RN on Ryder SystemCorey R [name]  being transferred to room 318 for routine progression of care       Report consisted of patient???s Situation, Background, Assessment and   Recommendations(SBAR).     Information from the following report(s) SBAR, ED Summary and MAR was reviewed with the receiving nurse.    Lines:   Peripheral IV 07/28/17 Left Antecubital (Active)   Site Assessment Clean, dry, & intact 07/28/2017  3:21 PM   Phlebitis Assessment 0 07/28/2017  3:21 PM   Infiltration Assessment 0 07/28/2017  3:21 PM   Dressing Status Clean, dry, & intact 07/28/2017  3:21 PM   Dressing Type Transparent 07/28/2017  3:21 PM   Hub Color/Line Status Pink;Patent;Flushed 07/28/2017  3:21 PM       Peripheral IV 07/28/17 Right Antecubital (Active)   Site Assessment Clean, dry, & intact 07/28/2017  6:12 PM   Phlebitis Assessment 0 07/28/2017  6:12 PM   Infiltration Assessment 0 07/28/2017  6:12 PM   Dressing Status Clean, dry, & intact 07/28/2017  6:12 PM   Dressing Type Transparent 07/28/2017  6:12 PM   Hub Color/Line Status Green;Patent 07/28/2017  6:12 PM        Opportunity for questions and clarification was provided.      Patient transported with:   [employer]

## 2017-07-28 NOTE — H&P (Signed)
[facility]  HISTORY AND PHYSICAL      Name:[name] [name] [name] [name]  MR#: [id number]  DOB: 1986-10-12  ACCOUNT #: [id number]   ADMIT DATE: 07/28/2017    ADMITTING DIAGNOSES:  Pancolitis.    HISTORY OF PRESENT ILLNESS:  The patient is 31 years old.  He has a diagnosis of ulcerative colitis.  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  He has had previous upper endoscopy and colonoscopy.  He has had ongoing GI issues for a number of years since his early 31s.  He was last hospitalized here in 2015.  During that time, he saw GI and he was treated with antibiotics, pain medicine IV fluids and with his chronic symptoms.  noted.  They were suggestive of cyclical vomiting syndrome.  He was smoking pot at that time as well.  His UDS is still positive for marijuana.  So he and his mother both confirm that he has a diagnosis of ulcerative colitis.  He has received no medical care since his last hospitalization here.  As far as any pathology, I do not see any results for that.  I was looking for any colonoscopy report, but I do not see it, so it looks like he has not had one at least done here.  So with that, he came into the emergency room today because at 6 o'clock this morning he started having acute abdominal pain, projectile vomiting at his job.  He could not control the vomiting.  He felt really sick and thus came over to the emergency room where a CT scan showed colitis.  He has received some antiemetic medications now and he states he is still having some head spinning, but he feels a little bit better.  He still smoking at least 1/4-1/2 pack of cigarettes a day.  He states that he drinks wine now and then, but does not drink to get "drunk."  He denies any illicit drug use.  Again, his UDS is positive for marijuana.  He has no primary medical care doctor right now.  He has not seen no GI doctor in many years.    MEDICATIONS:  He is taking no prescription medications.     ALLERGIES:  HE HAS NO MEDICATION ALLERGIES.    PAST SURGICAL HISTORY:  Cholecystectomy.    FAMILY HISTORY:  His mother has irritable bowel syndrome.    REVIEW OF SYSTEMS:  CONSTITUTIONAL:  He had fevers and chills at home today.  RESPIRATORY:  No shortness of breath or coughing.  CARDIOVASCULAR:  He had chest pain from vomiting.  No palpitations.  No leg swelling.  GASTROINTESTINAL:  He denies any diarrhea.  No blood in the stool.  He had no constipation.  He had protracted nausea and vomiting as well as generalized abdominal pain this morning.  MUSCULOSKELETAL:  No myalgias or arthralgias.  NEUROLOGIC:  No syncopal symptoms, but he was generally weak.    PHYSICAL EXAMINATION:  GENERAL:  He is awake and alert, thin white male in no acute distress, awakens easily.  VITAL SIGNS:  Temperature 97.8, pulse 77, blood pressure 121/83, respiratory rate of 16, SaO2 100% on room air.  HEENT:  Sclerae are anicteric.  Conjunctivae pink.  NECK:  Supple.  Oropharynx is dry, no lesions.  LUNGS:  Clear bilaterally.  No rales, rhonchi, wheezes.  HEART:  Regular rate and rhythm.  No murmur, rub or gallop.  ABDOMEN:  Flat, soft, fairly normal bowel sounds, tender  diffusely, but no rebound or guarding.  No acute abdomen symptoms or signs noted.    LOWER EXTREMITIES:  No clubbing, cyanosis or edema.  SKIN:  No rash on the skin.    LABORATORY DATA:  Urinalysis was unremarkable.  Ketones were 15.  BUN and creatinine 18 and 1.04.  Mag is normal.  LFTs cardiac markers and other electrolytes are within normal limits.  White count is 17.3, H and H 17 and 47.7, platelets are 270.  He has had blood cultures drawn.  CT scan of the abdomen and pelvis showed colonic wall thickening of the transverse colon as well as the terminal ileum suggesting infectious or inflammatory process.  He has received steroids and antibiotics in the emergency room.    ASSESSMENT:  1.  Colitis.  2.  Protracted nausea and vomiting.     PLAN:  We will continue steroids, IV fluid hydration with normal saline this evening, morphine as needed for severe pain, Zofran as needed for nausea or Phenergan if the Zofran is not effective.  If he has diarrhea tonight, we will get a stool culture.  There are no indications he has C. diff.  He has not been on any antibiotics or any other new medications lately.  The risk factors for that should be low.  He could have infectious etiology with food-borne infection.  We will check a CBC and metabolic panel tomorrow morning.  He can be on clear liquids tonight if he can tolerate it.  I have ordered SCDs for DVT prophylaxis.  We will continue antibiotics in the form of Cipro and Flagyl this evening and I have discussed the plan of care with his mother.  We expect at least 2-3 days in the hospital, most likely.  If he is not getting better by tomorrow, then we will consult to GI.  His vital signs are stable at least was noted in the emergency room record.  He does have a white count that is elevated.  There was no lactate performed.  He has no end organ issues that I can see outside of the leukocytosis and the colitis.  No measured fevers here.  He is not tachycardic.  He is not hypotensive.  I do not know that he meets sepsis criteria.  So with that, we will continue antibiotic therapy.  Pain control, IV fluid resuscitation and treatment of his acute colitis associated with nausea and vomiting.  Admission to the medical floor.      George Czar, MD       RI/DN  D: 07/28/2017 18:58     T: 07/28/2017 19:25  JOB #: [id number]

## 2017-07-28 NOTE — ED Provider Notes (Signed)
EMERGENCY DEPARTMENT HISTORY AND PHYSICAL EXAM    Date: 07/28/2017  Patient Name: George [name]    History of Presenting Illness     Chief Complaint   Patient presents with   ??? Abdominal Pain   ??? Vomiting         History Provided By: Patient    Chief Complaint: Abdominal pain  Duration: 9 Hours  Timing:  Acute  Location: Generalized abdomen  Quality: Burning and Sharp  Severity: 10 out of 10  Modifying Factors: No relieving or worsening factors  Associated Symptoms: mild pressure-like chest pain radiating from the abdomen,vomiting (multiple episodes), nausea, and diarrhea.    Additional History (Context):   3:12 PM  George [name] is a 31 y.o. male with PMHX of ulcerative colitis who presents to the emergency department C/O generalized 10/10 sharp, burning abdominal pain x~ 9 hours. Associated sxs include mild pressure-like chest pain radiating from the abdomen,vomiting (multiple episodes), nausea, and diarrhea. PSHx includes cholecystectomy. Pt reports that he has had numerous colonoscopies done, but he is unaware of the results. Notes that he has been admitted for UC flare ups in the past and last time he was admitted he had a colonoscopy at [place] to smoking ~ 0.25 ppd. Pt denies etoh use, illicit drug use, rectal bleeding, and any other sxs or complaints.     PCP: None        Past History     Past Medical History:  History reviewed. No pertinent past medical history.    Past Surgical History:  Past Surgical History:   Procedure Laterality Date   ??? HX CHOLECYSTECTOMY         Family History:  History reviewed. No pertinent family history.    Social History:  Social History   Substance Use Topics   ??? Smoking status: Current Every Day Smoker     Packs/day: 0.50   ??? Smokeless tobacco: Never Used   ??? Alcohol use No       Allergies:  No Known Allergies      Review of Systems   Review of Systems   Constitutional: Positive for activity change, appetite change and fever. Negative for chills.    Respiratory: Negative for cough and shortness of breath.    Cardiovascular: Positive for chest pain (mild, radiating from abdomen). Negative for palpitations.   Gastrointestinal: Positive for abdominal pain (generalized), diarrhea and vomiting. Negative for blood in stool, constipation and nausea.   Musculoskeletal: Negative for back pain.   Allergic/Immunologic: Negative for food allergies.   Neurological: Positive for weakness. Negative for light-headedness and headaches.   All other systems reviewed and are negative.      Physical Exam     Vitals:    07/28/17 1448 07/28/17 1811   BP: 121/67 121/83   Pulse: 72 77   Resp: 18 16   Temp: 97.8 ??F (36.6 ??C)    SpO2: 98% 100%   Weight: 61.2 kg (135 lb)    Height: 5' 6"  (1.676 m)      Physical Exam   Constitutional: He is oriented to person, place, and time. He appears well-developed and well-nourished. He has a sickly appearance. No distress.   Caucasian male that appears sick. Actively vomiting in hallway bed. Mother at bedside.    HENT:   Head: Normocephalic and atraumatic.   Right Ear: External ear normal.   Left Ear: External ear normal.   Nose: Nose normal.   Mouth/Throat: Uvula is midline. Mucous  membranes are dry.   Eyes: Conjunctivae are normal. No scleral icterus.   Neck: Normal range of motion.   Cardiovascular: Normal rate, regular rhythm, normal heart sounds and intact distal pulses.  Exam reveals no gallop and no friction rub.    No murmur heard.  Pulmonary/Chest: Effort normal and breath sounds normal. No accessory muscle usage. No tachypnea. No respiratory distress. He has no decreased breath sounds. He has no wheezes. He has no rhonchi. He has no rales.   Abdominal: Soft. Normal appearance and bowel sounds are normal. He exhibits no distension and no mass. There is generalized tenderness. There is guarding. There is no rigidity, no rebound and no CVA tenderness.       Musculoskeletal: Normal range of motion.    Neurological: He is alert and oriented to person, place, and time.   Skin: Skin is warm and dry. He is not diaphoretic.   Psychiatric: He has a normal mood and affect. Judgment normal.   Nursing note and vitals reviewed.        Diagnostic Study Results     Labs -     Recent Results (from the past 12 hour(s))   CBC WITH AUTOMATED DIFF    Collection Time: 07/28/17  3:22 PM   Result Value Ref Range    WBC 17.3 (H) 4.6 - 13.2 K/uL    RBC 5.77 (H) 4.70 - 5.50 M/uL    HGB 17.0 (H) 13.0 - 16.0 g/dL    HCT 47.7 36.0 - 48.0 %    MCV 82.7 74.0 - 97.0 FL    MCH 29.5 24.0 - 34.0 PG    MCHC 35.6 31.0 - 37.0 g/dL    RDW 12.8 11.6 - 14.5 %    PLATELET 270 135 - 420 K/uL    MPV 9.6 9.2 - 11.8 FL    NEUTROPHILS 90 (H) 40 - 73 %    LYMPHOCYTES 5 (L) 21 - 52 %    MONOCYTES 5 3 - 10 %    EOSINOPHILS 0 0 - 5 %    BASOPHILS 0 0 - 2 %    ABS. NEUTROPHILS 15.4 (H) 1.8 - 8.0 K/UL    ABS. LYMPHOCYTES 0.9 0.9 - 3.6 K/UL    ABS. MONOCYTES 0.9 0.05 - 1.2 K/UL    ABS. EOSINOPHILS 0.0 0.0 - 0.4 K/UL    ABS. BASOPHILS 0.0 0.0 - 0.1 K/UL    DF AUTOMATED     METABOLIC PANEL, COMPREHENSIVE    Collection Time: 07/28/17  3:22 PM   Result Value Ref Range    Sodium 139 136 - 145 mmol/L    Potassium 3.9 3.5 - 5.5 mmol/L    Chloride 102 100 - 108 mmol/L    CO2 23 21 - 32 mmol/L    Anion gap 14 3.0 - 18 mmol/L    Glucose 131 (H) 74 - 99 mg/dL    BUN 18 7.0 - 18 MG/DL    Creatinine 1.04 0.6 - 1.3 MG/DL    BUN/Creatinine ratio 17 12 - 20      GFR est AA >60 >60 ml/min/1.2m    GFR est non-AA >60 >60 ml/min/1.71m   Calcium 9.5 8.5 - 10.1 MG/DL    Bilirubin, total 0.6 0.2 - 1.0 MG/DL    ALT (SGPT) 31 16 - 61 U/L    AST (SGOT) 22 15 - 37 U/L    Alk. phosphatase 72 45 - 117 U/L    Protein, total 7.9 6.4 -  8.2 g/dL    Albumin 4.6 3.4 - 5.0 g/dL    Globulin 3.3 2.0 - 4.0 g/dL    A-G Ratio 1.4 0.8 - 1.7     LIPASE    Collection Time: 07/28/17  3:22 PM   Result Value Ref Range    Lipase 93 73 - 393 U/L   MAGNESIUM    Collection Time: 07/28/17  3:22 PM    Result Value Ref Range    Magnesium 1.7 1.6 - 2.6 mg/dL   CARDIAC PANEL,(CK, CKMB & TROPONIN)    Collection Time: 07/28/17  3:22 PM   Result Value Ref Range    CK 109 39 - 308 U/L    CK - MB <1.0 <3.6 ng/ml    CK-MB Index  0.0 - 4.0 %     CALCULATION NOT PERFORMED WHEN RESULT IS BELOW LINEAR LIMIT    Troponin-I, Qt. <0.02 0.00 - 0.06 NG/ML   ETHYL ALCOHOL    Collection Time: 07/28/17  3:22 PM   Result Value Ref Range    ALCOHOL(ETHYL),SERUM <3 0 - 3 MG/DL   EKG, 12 LEAD, INITIAL    Collection Time: 07/28/17  3:48 PM   Result Value Ref Range    Ventricular Rate 62 BPM    Atrial Rate 62 BPM    P-R Interval 136 ms    QRS Duration 106 ms    Q-T Interval 426 ms    QTC Calculation (Bezet) 432 ms    Calculated P Axis 63 degrees    Calculated R Axis 98 degrees    Calculated T Axis 69 degrees    Diagnosis       Sinus rhythm with marked sinus arrhythmia  Rightward axis  Borderline ECG  No previous ECGs available     DRUG SCREEN, URINE    Collection Time: 07/28/17  4:26 PM   Result Value Ref Range    BENZODIAZEPINES NEGATIVE  NEG      BARBITURATES NEGATIVE  NEG      THC (TH-CANNABINOL) POSITIVE (A) NEG      OPIATES POSITIVE (A) NEG      PCP(PHENCYCLIDINE) NEGATIVE  NEG      COCAINE NEGATIVE  NEG      AMPHETAMINES NEGATIVE  NEG      METHADONE NEGATIVE  NEG      HDSCOM (NOTE)    URINALYSIS W/ RFLX MICROSCOPIC    Collection Time: 07/28/17  4:27 PM   Result Value Ref Range    Color YELLOW      Appearance CLEAR      Specific gravity 1.024 1.005 - 1.030      pH (UA) 5.5 5.0 - 8.0      Protein NEGATIVE  NEG mg/dL    Glucose NEGATIVE  NEG mg/dL    Ketone 15 (A) NEG mg/dL    Bilirubin NEGATIVE  NEG      Blood NEGATIVE  NEG      Urobilinogen 0.2 0.2 - 1.0 EU/dL    Nitrites NEGATIVE  NEG      Leukocyte Esterase NEGATIVE  NEG         Radiologic Studies -   CT ABD PELV W CONT   Final Result   IMPRESSION:    Colonic wall thickening or transverse colon as well as the terminal ileum  suggesting infectious or inflammatory process.     Periportal edema    As read by the radiologist.      CT Results  (Last 48 hours)  07/28/17 1659  CT ABD PELV W CONT Final result    Impression:  IMPRESSION:       Colonic wall thickening or transverse colon as well as the terminal ileum   suggesting infectious or inflammatory process.       Periportal edema       Narrative:  EXAM: Abdomen and pelvis with IV contrast       INDICATION: Abdominal pain       COMPARISON: None.       TECHNIQUE: Axial CT imaging of the abdomen and pelvis was performed intravenous   contrast. Multiplanar reformats were generated. One or more dose reduction   techniques were used on this CT: automated exposure control, adjustment of the   Ams and/or kVp according to patient size, and iterative reconstruction   techniques.  The specific techniques used on this CT exam have been documented   in the patient's electronic medical record.       _______________       FINDINGS:       LOWER CHEST: Unremarkable.       LIVER, BILIARY: No focal hepatic lesion seen however there is periportal edema.   No biliary dilation. Gallbladder is unremarkable.       PANCREAS: Normal.       SPLEEN: Normal.       ADRENALS: Normal.       KIDNEYS: Small cortical cyst seen in the mid and lower poles of the right   kidney.       VASCULATURE: Unremarkable       LYMPH NODES: No enlarged lymph nodes.       GASTROINTESTINAL TRACT: There is diffuse colonic wall thickening of the   ascending and transverse colon suggesting colitis either infectious or   inflammatory. Appendix is normal. There is also wall thickening of the terminal   ileum       PELVIC ORGANS: Unremarkable.       BONES: No acute or aggressive osseous abnormalities identified.       OTHER: None.       _______________               CXR Results  (Last 48 hours)    None          Medications given in the ED-  Medications   piperacillin-tazobactam (ZOSYN) 4.5 g in 0.9% sodium chloride (MBP/ADV) 100 mL MBP (4.5 g IntraVENous New Bag 07/28/17 1804)    sodium chloride 0.9 % bolus infusion 1,000 mL (1,000 mL IntraVENous New Bag 07/28/17 1804)   promethazine (PHENERGAN) 12.5 mg in 0.9% sodium chloride 50 mL IVPB (not administered)   pantoprazole (PROTONIX) tablet 40 mg (not administered)   metroNIDAZOLE (FLAGYL) IVPB premix 500 mg (not administered)   ciprofloxacin (CIPRO) 400 mg IVPB (premix) (not administered)   0.9% sodium chloride infusion (not administered)   methylPREDNISolone (PF) (SOLU-MEDROL) injection 30 mg (not administered)   pantoprazole (PROTONIX) injection 40 mg (40 mg IntraVENous Given 07/28/17 1534)   ondansetron (ZOFRAN) injection 8 mg (8 mg IntraVENous Given 07/28/17 1534)   lactated ringers bolus infusion 1,000 mL (0 mL IntraVENous IV Completed 07/28/17 1550)     Followed by   lactated ringers bolus infusion 1,000 mL (0 mL IntraVENous IV Completed 07/28/17 1550)   dicyclomine (BENTYL) 10 mg/mL injection 20 mg (20 mg IntraMUSCular Given 07/28/17 1534)   morphine injection 4 mg (4 mg IntraVENous Given 07/28/17 1600)   promethazine (PHENERGAN) 25 mg in 0.9% sodium chloride 50 mL  IVPB (0 mg IntraVENous IV Completed 07/28/17 1716)   morphine injection 4 mg (4 mg IntraVENous Given 07/28/17 1701)   iopamidol (ISOVUE 300) 61 % contrast injection 100 mL (100 mL IntraVENous Given 07/28/17 1646)   methylPREDNISolone (PF) (SOLU-MEDROL) injection 125 mg (125 mg IntraVENous Given 07/28/17 1804)   metoclopramide HCl (REGLAN) injection 10 mg (10 mg IntraVENous Given 07/28/17 1804)         Medical Decision Making   I am the first provider for this patient.    I reviewed the vital signs, available nursing notes, past medical history, past surgical history, family history and social history.    Vital Signs-Reviewed the patient's vital signs.    EKG interpretation: (Preliminary)  3:48 PM  62 bpm; Sinus rhythm w/marked sinus arrhythmia; No STE  EKG read by Dionne Bucy, PA-C at 4:09 PM     Pulse Oximetry Analysis - 98% on room air     Records Reviewed: Nursing Notes and Old Medical Records    Provider Notes (Medical Decision Making): colitis, UC, crohn's, diverticulitis, food borne, viral, gastroenteritis, pancreatitis, hepatitis, CHS, dehydration, metabolic derangement. Doubt cardiac or AAA.     Procedures:  Procedures    ED Course:   3:12 PM Initial assessment performed. The patients presenting problems have been discussed, and they are in agreement with the care plan formulated and outlined with them.  I have encouraged them to ask questions as they arise throughout their visit.    4:24 PM Pt states the pain is starting to return. Will give additional medication.    5:53 PM  Pt continues to vomit in CT scanner and in ED room. Ordered additional meds. leucocytosis with left shift. CT reveals colitis at multiple sites. Solumedrol for possible inflammatory etiology (UC). Will give additional fluids, BC x2, lactate, and IV Zosyn. Hospitalist for admission.     5:56 PM Discussed patient's history, exam, and available diagnostics results with Ocie Cornfield, MD, internal medicine, who accepts the pt for admission to medical.     Diagnosis and Disposition     Core Measures:  For Hospitalized Patients:    1. Hospitalization Decision Time:  The decision to hospitalize the patient was made by Dionne Bucy, PA-C at 5:53 PM on 07/28/2017    2. Aspirin: Aspirin was not given because the patient did not present with a stroke at the time of their Emergency Department evaluation    5:59 PM  Patient is being admitted to the hospital by Ocie Cornfield, MD to medical. The results of their tests and reasons for their admission have been discussed with them and/or available family. They convey agreement and understanding for the need to be admitted and for their admission diagnosis.      CONDITIONS ON ADMISSION:  Deep Vein Thrombosis is not present at the time of admission. Thrombosis is not present at the time of admission. Urinary Tract Infection is not  present at the time of admission. MRSA is not present at the time of admission. Wound infection is not present at the time of admission. Pressure Ulcer is not present at the time of admission.      CLINICAL IMPRESSION:    1. Acute colitis    2. Intractable vomiting with nausea, unspecified vomiting type    3. Marijuana use        PLAN:  1. D/C Home  2. There are no discharge medications for this patient.    3.   Follow-up Information     None  _______________________________    Attestations:  This note is prepared by Blima Ledger and Lindajo Royal, acting as Education administrator for [employer], PA-C.    Dionne Bucy, PA-C:  The scribe's documentation has been prepared under my direction and personally reviewed by me in its entirety.  I confirm that the note above accurately reflects all work, treatment, procedures, and medical decision making performed by me.  _______________________________

## 2017-07-29 LAB — CBC W/O DIFF
HCT: 38.2 % (ref 36.0–48.0)
HGB: 13.3 g/dL (ref 13.0–16.0)
MCH: 29.2 PG (ref 24.0–34.0)
MCHC: 34.8 g/dL (ref 31.0–37.0)
MCV: 83.8 FL (ref 74.0–97.0)
MPV: 9.4 FL (ref 9.2–11.8)
PLATELET: 227 10*3/uL (ref 135–420)
RBC: 4.56 M/uL — ABNORMAL LOW (ref 4.70–5.50)
RDW: 12.9 % (ref 11.6–14.5)
WBC: 8.1 10*3/uL (ref 4.6–13.2)

## 2017-07-29 LAB — METABOLIC PANEL, COMPREHENSIVE
A-G Ratio: 1.2 (ref 0.8–1.7)
ALT (SGPT): 18 U/L (ref 16–61)
AST (SGOT): 18 U/L (ref 15–37)
Albumin: 3.3 g/dL — ABNORMAL LOW (ref 3.4–5.0)
Alk. phosphatase: 56 U/L (ref 45–117)
Anion gap: 10 mmol/L (ref 3.0–18)
BUN/Creatinine ratio: 12 (ref 12–20)
BUN: 10 MG/DL (ref 7.0–18)
Bilirubin, total: 0.5 MG/DL (ref 0.2–1.0)
CO2: 24 mmol/L (ref 21–32)
Calcium: 8.6 MG/DL (ref 8.5–10.1)
Chloride: 103 mmol/L (ref 100–108)
Creatinine: 0.86 MG/DL (ref 0.6–1.3)
GFR est AA: >60 mL/min/{1.73_m2} (ref >60)
GFR est non-AA: >60 mL/min/{1.73_m2} (ref >60)
Globulin: 2.8 g/dL (ref 2.0–4.0)
Glucose: 140 mg/dL — ABNORMAL HIGH (ref 74–99)
Potassium: 3.8 mmol/L (ref 3.5–5.5)
Protein, total: 6.1 g/dL — ABNORMAL LOW (ref 6.4–8.2)
Sodium: 137 mmol/L (ref 136–145)

## 2017-07-29 MED ORDER — SIMETHICONE 80 MG CHEWABLE TAB
80 mg | Freq: Four times a day (QID) | ORAL | Status: DC | PRN
Start: 2017-07-29 — End: 2017-07-30
  Administered 2017-07-29 – 2017-07-30 (×2): via ORAL

## 2017-07-29 MED ORDER — METHYLPREDNISOLONE (PF) 40 MG/ML IJ SOLR
40 mg/mL | Freq: Two times a day (BID) | INTRAMUSCULAR | Status: DC
Start: 2017-07-29 — End: 2017-07-30
  Administered 2017-07-30 (×2): via INTRAVENOUS

## 2017-07-29 MED FILL — SOLU-MEDROL (PF) 40 MG/ML SOLUTION FOR INJECTION: 40 mg/mL | INTRAMUSCULAR | Qty: 1

## 2017-07-29 MED FILL — METRONIDAZOLE IN SODIUM CHLORIDE (ISO-OSM) 500 MG/100 ML IV PIGGY BACK: 500 mg/100 mL | INTRAVENOUS | Qty: 100

## 2017-07-29 MED FILL — CIPROFLOXACIN IN D5W 400 MG/200 ML IV PIGGY BACK: 400 mg/200 mL | INTRAVENOUS | Qty: 200

## 2017-07-29 MED FILL — MORPHINE 2 MG/ML INJECTION: 2 mg/mL | INTRAMUSCULAR | Qty: 1

## 2017-07-29 MED FILL — PANTOPRAZOLE 40 MG TAB, DELAYED RELEASE: 40 mg | ORAL | Qty: 1

## 2017-07-29 MED FILL — SIMETHICONE 80 MG CHEWABLE TAB: 80 mg | ORAL | Qty: 1

## 2017-07-29 NOTE — Other (Signed)
Bedside shift change report given to Tim Dyer RN (oncoming nurse) by Jessica Polanco RN (offgoing nurse). Report included the following information SBAR, Kardex, MAR, Recent Results and Alarm Parameters .

## 2017-07-29 NOTE — Progress Notes (Addendum)
Chart reviewed.  Pt admitted in observation status for acute colitis.  CM will follow for discharge planning needs.    1100  Met with pt at bedside.  Pt lives w family.  Pts family/friend will drive him home at discharge.  Pts home address confirmed per face sheet.  Pt denies using any DME. Per Sam H PA, pt may be stable to dc tomorrow.  Pt not established with any PCP.  CMS Lolita Patella has assisted in getting pt follow up at the [place].  Pt had questions about his diet order at hospital.  Primary RN Tim made aware.  No other dc concerns identified.    Reason for Admission:  Per H&P, pt is 31 years old.  He has a diagnosis of ulcerative colitis.  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  He has had previous upper endoscopy and colonoscopy.  He has had ongoing GI issues for a number of years since his early 14s.  He was last hospitalized here in 2015.  During that time, he saw GI and he was treated with antibiotics, pain medicine IV fluids and with his chronic symptoms.  noted.  They were suggestive of cyclical vomiting syndrome.  He was smoking pot at that time as well.  His UDS is still positive for marijuana.  So he and his mother both confirm that he has a diagnosis of ulcerative colitis.  He has received no medical care since his last hospitalization here.  As far as any pathology, I do not see any results for that.  I was looking for any colonoscopy report, but I do not see it, so it looks like he has not had one at least done here.  So with that, he came into the emergency room today because at 6 o'clock this morning he started having acute abdominal pain, projectile vomiting at his job.  He could not control the vomiting.  He felt really sick and thus came over to the emergency room where a CT scan showed colitis.  He has received some antiemetic medications now and he states he is still having some head spinning, but he feels a little bit better.  He still smoking at least  1/4-1/2 pack of cigarettes a day.  He states that he drinks wine now and then, but does not drink to get "drunk."  He denies any illicit drug use.  Again, his UDS is positive for marijuana.  He has no primary medical care doctor right now.  He has not seen no GI doctor in many years                    RRAT Score:     low                Plan for utilizing home health:     None at this time                     Likelihood of Readmission:  low                         Transition of Care Plan:        Discharge with family assistance and MD follow up              Care Management Interventions  PCP Verified by CM: No (no PCP, will assist in getting [facility] follow [facility])  Mode  of Transport at Discharge: Other (see comment) (friend/family)  Transition of Care Consult (CM Consult): Discharge Planning  Current Support Network: [place] (lives with family)  Plan discussed with Pt/Family/Caregiver: Yes  Discharge Location  Discharge Placement: Home with family assistance

## 2017-07-29 NOTE — Progress Notes (Signed)
Shift Summary- Pt was able to keep down cup of chicken broth but began c/o of abdomen pain after. Pt medicated with morphine. SCD's in place.      Patient Vitals for the past 12 hrs:   Temp Pulse Resp BP SpO2   07/29/17 0319 98.1 ??F (36.7 ??C) 69 16 102/48 95 %   07/28/17 2309 98.5 ??F (36.9 ??C) 73 18 104/53 96 %   07/28/17 1929 98.1 ??F (36.7 ??C) 79 16 103/61 100 %   07/28/17 1811 - 77 16 121/83 100 %

## 2017-07-29 NOTE — Progress Notes (Addendum)
0720-received report from [employer] RN included SBAR MAR and Kardex.  Shift summary-no change in pt status.  Bedside and Verbal shift change report given to Larose Kells RN (oncoming nurse) by Ronnell Freshwater RN (offgoing nurse). Report included the following information SBAR, Kardex and MAR.

## 2017-07-29 NOTE — Other (Signed)
Letter of Admission Status Determination: Upgrade to Inpatient     George [name] was hospitalized as observation status on 07/28/2017.    Since Mr. George [name] is expected to need at least two midnights of medically necessary hospital care, he will meet the benchmark for Inpatient Admission.     Based on the documented clinical condition and care plan, we recommend upgrading patient's hospitalization status to INPATIENT.     The final decision regarding the patient's hospitalization status depends on the attending physician's clinical judgment.       Pearline CablesMihai F. Josephmichael Lisenbee, MD, MBA, FACP, [facility] Campus[facility]  Physician Advisor  [facility]  [phone]

## 2017-07-29 NOTE — Progress Notes (Signed)
Oral and Written notification given to patient and/or caregiver informing them that they are currently an Outpatient receiving care in our facility.  Outpatient services include Observation Services under [facility] and [facility] requirements.

## 2017-07-29 NOTE — Progress Notes (Signed)
Hospitalist Progress Note    Patient: Loraine MapleCorey R Daubert MRN: [id number]  CSN: [id number]    Date of Birth: 03/17/1986  Age: 31 y.o.  Sex: male    DOA: 07/28/2017 LOS:  LOS: 0 days          Chief Complaint:    Abdominal Pain    Assessment/Plan     1. Acute Colitis  2. Nausea/Vomiting    1. Patient states that his pain has improved somewhat this AM. Able to tolerate chicken broth without issues last night and this AM. Continue antibiotics and IV steroids. Pain control as needed. Patient states he takes Gas-X at home, will order Mylicon as needed for this.   2. As above, patient tolerating broth without issues. No nausea or vomiting this AM. If patient tolerates broth for lunch and would like to advance diet, he may do so. Will monitor closely for any recurrence of N/V. Continue phenergan and zofran.    DVT Prophylaxis - SCDs  Disposition - 1-2 days    Patient Active Problem List   Diagnosis Code   ??? Colitis K52.9   ??? Leukocytosis D72.829   ??? Acute colitis K52.9   ??? Intractable nausea and vomiting R11.2       Subjective:    Slept most of night  Feeling ok this AM  Some pain when he moves around    Review of systems:    Constitutional: denies fevers, chills, myalgias  Respiratory: denies SOB, cough  Cardiovascular: denies chest pain, palpitations  Gastrointestinal: denies nausea, vomiting, diarrhea. +abd pain      Vital signs/Intake and Output:  Visit Vitals   ??? BP 101/52 (BP 1 Location: Left arm, BP Patient Position: At rest)   ??? Pulse 72   ??? Temp 98 ??F (36.7 ??C)   ??? Resp 14   ??? Ht 5\' 6"  (1.676 m)   ??? Wt 62.7 kg (138 lb 4.8 oz)   ??? SpO2 96%   ??? BMI 22.32 kg/m2     Current Shift:     Last three shifts:  09/25 1901 - 09/27 0700  In: 2722.9 [P.O.:1000; I.V.:1722.9]  Out: 1675 [Urine:1675]    Exam:    General: Well developed, alert, NAD, OX3  Head/Neck: NCAT, supple, No masses, No lymphadenopathy  NFA:OZHYQMVCVS:Regular rate and rhythm, no M/R/G, S1/S2 heard, no thrill   Lungs:Clear to auscultation bilaterally, no wheezes, rhonchi, or rales  Abdomen: Soft, mild diffuse tenderness, No distention, Normal Bowel sounds, No hepatomegaly  Extremities: No C/C/E, pulses palpable 2+  Skin:normal texture and turgor, no rashes, no lesions  Neuro:grossly normal , follows commands  Psych:appropriate                Labs: Results:       Chemistry Recent Labs      07/29/17   0452  07/28/17   1522   GLU  140*  131*   NA  137  139   K  3.8  3.9   CL  103  102   CO2  24  23   BUN  10  18   CREA  0.86  1.04   CA  8.6  9.5   AGAP  10  14   BUCR  12  17   AP  56  72   TP  6.1*  7.9   ALB  3.3*  4.6   GLOB  2.8  3.3   AGRAT  1.2  1.4  CBC w/Diff Recent Labs      07/29/17   0452  07/28/17   1522   WBC  8.1  17.3*   RBC  4.56*  5.77*   HGB  13.3  17.0*   HCT  38.2  47.7   PLT  227  270   GRANS   --   90*   LYMPH   --   5*   EOS   --   0      Cardiac Enzymes Recent Labs      07/28/17   1522   CPK  109   CKND1  CALCULATION NOT PERFORMED WHEN RESULT IS BELOW LINEAR LIMIT      Coagulation No results for input(s): PTP, INR, APTT in the last 72 hours.    No lab exists for component: INREXT    Lipid Panel No results found for: CHOL, CHOLPOCT, CHOLX, CHLST, CHOLV, 884269, HDL, LDL, LDLC, DLDLP, 578469, VLDLC, VLDL, TGLX, TRIGL, TRIGP, TGLPOCT, CHHD, CHHDX   BNP No results for input(s): BNPP in the last 72 hours.   Liver Enzymes Recent Labs      07/29/17   0452   TP  6.1*   ALB  3.3*   AP  56   SGOT  18      Thyroid Studies No results found for: T4, T3U, TSH, TSHEXT     Procedures/imaging: see electronic medical records for all procedures/Xrays and details which were not copied into this note but were reviewed prior to creation of Plan      Velva Harman, PA-C

## 2017-07-30 MED ORDER — PREDNISONE 10 MG TAB
10 mg | ORAL_TABLET | ORAL | 0 refills | Status: AC
Start: 2017-07-30 — End: ?

## 2017-07-30 MED ORDER — CIPROFLOXACIN 500 MG TAB
500 mg | ORAL_TABLET | Freq: Two times a day (BID) | ORAL | 0 refills | Status: AC
Start: 2017-07-30 — End: 2017-08-04

## 2017-07-30 MED ORDER — PANTOPRAZOLE 40 MG TAB, DELAYED RELEASE
40 mg | ORAL_TABLET | Freq: Two times a day (BID) | ORAL | 0 refills | Status: AC
Start: 2017-07-30 — End: ?

## 2017-07-30 MED ORDER — METRONIDAZOLE 500 MG TAB
500 mg | ORAL_TABLET | Freq: Three times a day (TID) | ORAL | 0 refills | Status: AC
Start: 2017-07-30 — End: 2017-08-04

## 2017-07-30 MED FILL — SIMETHICONE 80 MG CHEWABLE TAB: 80 mg | ORAL | Qty: 1

## 2017-07-30 MED FILL — PANTOPRAZOLE 40 MG TAB, DELAYED RELEASE: 40 mg | ORAL | Qty: 1

## 2017-07-30 MED FILL — METRONIDAZOLE IN SODIUM CHLORIDE (ISO-OSM) 500 MG/100 ML IV PIGGY BACK: 500 mg/100 mL | INTRAVENOUS | Qty: 100

## 2017-07-30 MED FILL — CIPROFLOXACIN IN D5W 400 MG/200 ML IV PIGGY BACK: 400 mg/200 mL | INTRAVENOUS | Qty: 200

## 2017-07-30 MED FILL — MORPHINE 2 MG/ML INJECTION: 2 mg/mL | INTRAMUSCULAR | Qty: 1

## 2017-07-30 MED FILL — SOLU-MEDROL (PF) 40 MG/ML SOLUTION FOR INJECTION: 40 mg/mL | INTRAMUSCULAR | Qty: 1

## 2017-07-30 NOTE — Discharge Summary (Signed)
Discharge Summary by Velva HarmanHolloway, Jamarea Selner K at 07/30/17 [id number]                Author: Velva HarmanHolloway, Debe Anfinson K  Service: Internal Medicine  Author Type: Physician Assistant       Filed: 07/30/17 0929  Date of Service: 07/30/17 0920  Status: Attested           Editor: Velva HarmanHolloway, Valina Maes K  Cosigner: Gar PontoInnes, Ruth H, MD at 07/30/17 1233          Attestation signed by Gar PontoInnes, Ruth H, MD at 07/30/17 1233          I was personally available for consultation on this patient. I have independently seen and examined the patient. I agree with the assessment and treatment plan  as documented.                                          Discharge Summary          Patient: George [name]  MRN: [id number]   CSN: [id number]          Date of Birth: 07/27/1986   Age: 31 y.o.   Sex: male      DOA: 07/28/2017  LOS:  LOS: 1 day    Discharge Date:         Primary Care Provider:  None      Admission Diagnoses: Acute colitis   Leukocytosis   Intractable nausea and vomiting   Colitis, acute      Discharge Diagnoses:        Problem List as of 07/30/2017   Never Reviewed                Codes  Class  Noted - Resolved             Colitis, acute  ICD-10-CM: K52.9   ICD-9-CM: 558.9    07/29/2017 - Present                       Leukocytosis  ICD-10-CM: D72.829   ICD-9-CM: 288.60    07/28/2017 - Present                       Acute colitis  ICD-10-CM: K52.9   ICD-9-CM: 558.9    07/28/2017 - Present                       Intractable nausea and vomiting  ICD-10-CM: R11.2   ICD-9-CM: 536.2    07/28/2017 - Present                       Colitis  ICD-10-CM: K52.9   ICD-9-CM: 558.9    12/19/2013 - Present                          Discharge Medications:        Current Discharge Medication List              START taking these medications          Details        pantoprazole (PROTONIX) 40 mg tablet  Take 1 Tab by mouth Before breakfast and dinner.   Qty: 10 Tab, Refills:  0  ciprofloxacin HCl (CIPRO) 500 mg tablet  Take 1 Tab by mouth two (2) times a day for 5  days.   Qty: 10 Tab, Refills:  0               metroNIDAZOLE (FLAGYL) 500 mg tablet  Take 1 Tab by mouth three (3) times daily for 5 days.   Qty: 15 Tab, Refills:  0               predniSONE (DELTASONE) 10 mg tablet  Days 1 & 2: Take FOUR tabs. Days 3 & 4: Take THREE tabs. Days 5 & 6: Take TWO tabs. Days 7 & 8: Take ONE tab.   Qty: 20 Tab, Refills:  0                         Discharge Condition: Stable      Procedures : None      Consults: None         PHYSICAL EXAM       Visit Vitals         ?  BP  93/47 (BP 1 Location: Right arm)     ?  Pulse  (!) 56     ?  Temp  97.7 ??F (36.5 ??C)     ?  Resp  18     ?  Ht  5\' 6"  (1.676 m)     ?  Wt  62.7 kg (138 lb 4.8 oz)     ?  SpO2  95%         ?  BMI  22.32 kg/m2        General: Awake, cooperative, no acute distress????   HEENT: NC, Atraumatic.  PERRLA, EOMI. Anicteric sclerae.   Lungs:  CTA Bilaterally. No Wheezing/Rhonchi/Rales.   Heart:  Regular  rhythm,?? No murmur, No Rubs, No Gallops   Abdomen: Soft, Non distended, Non tender. +Bowel sounds,    Extremities: No c/c/e   Psych:???? Not anxious or agitated.   Neurologic:?? No acute neurological deficits.                                         Admission HPI : The patient is 31 years old.  He has a diagnosis of ulcerative colitis.  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  He has had previous upper endoscopy and colonoscopy.  He has had ongoing GI issues for a number of years since his early 73s.  He was last hospitalized here in 2015.  During that time, he  saw GI and he was treated with antibiotics, pain medicine IV fluids and with his chronic symptoms.   noted.  They were suggestive of cyclical vomiting syndrome.  He was smoking pot at that time as well.  His UDS is still positive for marijuana.  So  he and his mother both confirm that he has a diagnosis of ulcerative colitis.  He has received no medical care since his last hospitalization here.  As far as any pathology, I do not see any results  for that.  I was looking for any colonoscopy report,  but I do not see it, so it looks like he has not had one at least done here.  So with that, he came into the emergency room today because  at 6 o'clock this morning he started having acute abdominal pain, projectile vomiting at his job.  He could not  control the vomiting.  He felt really sick and thus came over to the emergency room where a CT scan showed colitis.  He has received some antiemetic medications now and he states he is still having some head spinning, but he feels a little bit better.   He still smoking at least 1/4-1/2 pack of cigarettes a day.  He states that he drinks wine now and then, but does not drink to get "drunk."  He denies any illicit drug use.  Again, his UDS is positive for marijuana.  He has no primary medical care  doctor right now.  He has not seen no GI doctor in many years.      Hospital Course : This patient was admitted to medical services on July 28, 2017 for abdominal pain, nausea, and vomiting. The patient had a CT scan upon admission which  revealed colitis.      The patient was admitted to the medical floor, and was started on IV Cipro, IV Flagyl, and IV Solumedrol. He was also getting IV fluids at the initial time of admission. The patient was continued on these IV medications until discharge. He was allowed  clear liquids initially, which he tolerated without significant nausea, vomiting, or abdominal pain. He then requested that his diet be advanced, and he tolerated a regular diet without issues.       At the time of examination today, the patient is in no acute distress, without abdominal pain, and wishing to go home. The patient will be discharged on another 5 days of cipro and flagyl for a total of 7 days of antibiotics. He will also receive a steroid  taper upon discharge.       [facility]       Activity: Activity as tolerated      Diet: Regular  Diet      Follow-up: PCP at the [place]      Disposition: Home      Minutes spent on discharge: 35             Labs:  Results:                  Chemistry  Recent Labs          07/29/17    0452   07/28/17    1522      GLU   140*   131*      NA   137   139      K   3.8   3.9      CL   103   102      CO2   24   23      BUN   10   18      CREA   0.86   1.04      CA   8.6   9.5      AGAP   10   14      BUCR   12   17      AP   56   72      TP   6.1*   7.9      ALB   3.3*   4.6      GLOB   2.8   3.3  AGRAT   1.2   1.4                 CBC w/Diff  Recent Labs          07/29/17    0452   07/28/17    1522      WBC   8.1   17.3*      RBC   4.56*   5.77*      HGB   13.3   17.0*      HCT   38.2   47.7      PLT   227   270      GRANS    --    90*      LYMPH    --    5*      EOS    --    0                 Cardiac Enzymes  Recent Labs          07/28/17    1522      CPK   109      CKND1   CALCULATION NOT PERFORMED WHEN RESULT IS BELOW LINEAR LIMIT                 Coagulation  No results for input(s): PTP, INR, APTT in the last 72 hours.      No lab exists for component: INREXT, INREXT      Lipid Panel  No results found for: CHOL, CHOLPOCT, CHOLX, CHLST, CHOLV, 884269, HDL, LDL, LDLC, DLDLP, 960454, VLDLC, VLDL, TGLX, TRIGL, TRIGP, TGLPOCT, CHHD, CHHDX     BNP  No results for input(s): BNPP in the last 72 hours.        Liver Enzymes  Recent Labs          07/29/17    0452      TP   6.1*      ALB   3.3*      AP   56      SGOT   18                 Thyroid Studies  No results found for: T4, T3U, TSH, TSHEXT, TSHEXT             Significant Diagnostic Studies: Ct Abd Pelv W Cont      Result Date: 07/28/2017   EXAM: Abdomen and pelvis with IV contrast INDICATION: Abdominal pain COMPARISON: None. TECHNIQUE: Axial CT imaging of the abdomen and pelvis was performed intravenous contrast. Multiplanar reformats were generated. One or more dose reduction techniques  were used on this CT: automated exposure control, adjustment of the Ams and/or kVp  according to patient size, and iterative reconstruction techniques.  The specific techniques used on this CT exam have been documented in the patient's electronic medical  record. _______________ FINDINGS: LOWER CHEST: Unremarkable. LIVER, BILIARY: No focal hepatic lesion seen however there is periportal edema. No biliary dilation. Gallbladder is unremarkable. PANCREAS: Normal. SPLEEN: Normal. ADRENALS: Normal. KIDNEYS:  Small cortical cyst seen in the mid and lower poles of the right kidney. VASCULATURE: Unremarkable LYMPH NODES: No enlarged lymph nodes. GASTROINTESTINAL TRACT: There is diffuse colonic wall thickening of the ascending and transverse colon suggesting  colitis either infectious or inflammatory. Appendix is normal. There is also wall thickening of the terminal ileum PELVIC ORGANS: Unremarkable. BONES: No acute or aggressive osseous abnormalities identified. OTHER: None. _______________  IMPRESSION: Colonic wall thickening or transverse colon as well as the terminal ileum suggesting infectious or inflammatory process. Periportal edema            No results found for this or any previous visit.            CC: None

## 2017-07-30 NOTE — Progress Notes (Signed)
Chart reviewed.  Per Sam H PA, pt will dc home today.  CMS Cathlean CowerLesley has assisted [facility].  Pt independent.  No needs anticipated.  CM will cont to be available.      Care Management Interventions  PCP Verified by CM: No (no PCP, will assist in getting [facility])  Mode of Transport at Discharge: Other (see comment) (friend/family)  Transition of Care Consult (CM Consult): Discharge Planning  Current Support Network: Relative's Home (lives with family)  Plan discussed with Pt/Family/Caregiver: Yes  Discharge Location  Discharge Placement: Home with family assistance

## 2017-07-30 NOTE — Other (Signed)
1900-Bedside shift change report given to Marissa NestleSherri Smalls, RN (oncoming nurse) by Jewel Baizeimothy D, Rn (offgoing nurse). Report included the following information SBAR, Kardex and MAR. Patient alert and oriented x4, no distress noted.      2300-Patient resting at this time. Family at bedside.              0700-Bedside shift change report given to Timothy,Rn (Cabin crewoncoming nurse) by Marissa NestleSherri Smalls, RN (offgoing nurse). Report included the following information SBAR, Kardex and MAR. Patient sleeping, Bed in lowest position and wheels locked. No distress noted.

## 2017-07-30 NOTE — Discharge Summary (Signed)
Discharge Summary    Patient: George [name] MRN: [id number]  CSN: [id number]    Date of Birth: 07-04-1986  Age: 31 y.o.  Sex: male    DOA: 07/28/2017 LOS:  LOS: 1 day   Discharge Date:      Primary Care Provider:  None    Admission Diagnoses: Acute colitis  Leukocytosis  Intractable nausea and vomiting  Colitis, acute    Discharge Diagnoses:    Problem List as of 07/30/2017  Never Reviewed          Codes Class Noted - Resolved    Colitis, acute ICD-10-CM: K52.9  ICD-9-CM: 558.9  07/29/2017 - Present        Leukocytosis ICD-10-CM: D72.829  ICD-9-CM: 288.60  07/28/2017 - Present        Acute colitis ICD-10-CM: K52.9  ICD-9-CM: 558.9  07/28/2017 - Present        Intractable nausea and vomiting ICD-10-CM: R11.2  ICD-9-CM: 536.2  07/28/2017 - Present        Colitis ICD-10-CM: K52.9  ICD-9-CM: 558.9  12/19/2013 - Present              Discharge Medications:     Current Discharge Medication List      START taking these medications    Details   pantoprazole (PROTONIX) 40 mg tablet Take 1 Tab by mouth Before breakfast and dinner.  Qty: 10 Tab, Refills: 0      ciprofloxacin HCl (CIPRO) 500 mg tablet Take 1 Tab by mouth two (2) times a day for 5 days.  Qty: 10 Tab, Refills: 0      metroNIDAZOLE (FLAGYL) 500 mg tablet Take 1 Tab by mouth three (3) times daily for 5 days.  Qty: 15 Tab, Refills: 0      predniSONE (DELTASONE) 10 mg tablet Days 1 & 2: Take FOUR tabs. Days 3 & 4: Take THREE tabs. Days 5 & 6: Take TWO tabs. Days 7 & 8: Take ONE tab.  Qty: 20 Tab, Refills: 0             Discharge Condition: Stable    Procedures : None    Consults: None      PHYSICAL EXAM    Visit Vitals   ??? BP 93/47 (BP 1 Location: Right arm)   ??? Pulse (!) 56   ??? Temp 97.7 ??F (36.5 ??C)   ??? Resp 18   ??? Ht  (1.676 m)   ??? Wt 62.7 kg (138 lb 4.8 oz)   ??? SpO2 95%   ??? BMI 22.32 kg/m2     General: Awake, cooperative, no acute distress????  HEENT: NC, Atraumatic.  PERRLA, EOMI. Anicteric sclerae.  Lungs:  CTA Bilaterally. No Wheezing/Rhonchi/Rales.   Heart:  Regular  rhythm,?? No murmur, No Rubs, No Gallops  Abdomen: Soft, Non distended, Non tender. +Bowel sounds,   Extremities: No c/c/e  Psych:???? Not anxious or agitated.  Neurologic:?? No acute neurological deficits.                                     Admission HPI : The patient is 31 years old.  He has a diagnosis of ulcerative colitis.  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  He has had previous upper endoscopy and colonoscopy.  He has had ongoing GI issues for a number of years since his early 53s.  He was last hospitalized here in 2015.  During that time, he saw GI and he was treated with antibiotics, pain medicine IV fluids and with his chronic symptoms.  noted.  They were suggestive of cyclical vomiting syndrome.  He was smoking pot at that time as well.  His UDS is still positive for marijuana.  So he and his mother both confirm that he has a diagnosis of ulcerative colitis.  He has received no medical care since his last hospitalization here.  As far as any pathology, I do not see any results for that.  I was looking for any colonoscopy report, but I do not see it, so it looks like he has not had one at least done here.  So with that, he came into the emergency room today because at 6 o'clock this morning he started having acute abdominal pain, projectile vomiting at his job.  He could not control the vomiting.  He felt really sick and thus came over to the emergency room where a CT scan showed colitis.  He has received some antiemetic medications now and he states he is still having some head spinning, but he feels a little bit better.  He still smoking at least 1/4-1/2 pack of cigarettes a day.  He states that he drinks wine now and then, but does not drink to get "drunk."  He denies any illicit drug use.  Again, his UDS is positive for marijuana.  He has no primary medical care doctor right now.  He has not seen no GI doctor in many years.     Hospital Course : This patient was admitted to medical services on July 28, 2017 for abdominal pain, nausea, and vomiting. The patient had a CT scan upon admission which revealed colitis.    The patient was admitted to the medical floor, and was started on IV Cipro, IV Flagyl, and IV Solumedrol. He was also getting IV fluids at the initial time of admission. The patient was continued on these IV medications until discharge. He was allowed clear liquids initially, which he tolerated without significant nausea, vomiting, or abdominal pain. He then requested that his diet be advanced, and he tolerated a regular diet without issues.     At the time of examination today, the patient is in no acute distress, without abdominal pain, and wishing to go home. The patient will be discharged on another 5 days of cipro and flagyl for a total of 7 days of antibiotics. He will also receive a steroid taper upon discharge.     [facility]     Activity: Activity as tolerated    Diet: Regular Diet    Follow-up: [facility]CP at [facility]    Disposition: Home    Minutes spent on discharge: 35       Labs: Results:       Chemistry Recent Labs      07/29/17   0452  07/28/17   1522   GLU  140*  131*   NA  137  139   K  3.8  3.9   CL  103  102   CO2  24  23   BUN  10  18   CREA  0.86  1.04   CA  8.6  9.5   AGAP  10  14   BUCR  12  17   AP  56  72   TP  6.1*  7.9   ALB  3.3*  4.6   GLOB  2.8  3.3   AGRAT  1.2  1.4      CBC w/Diff Recent Labs      07/29/17   0452  07/28/17   1522   WBC  8.1  17.3*   RBC  4.56*  5.77*   HGB  13.3  17.0*   HCT  38.2  47.7   PLT  227  270   GRANS   --   90*   LYMPH   --   5*   EOS   --   0      Cardiac Enzymes Recent Labs      07/28/17   1522   CPK  109   CKND1  CALCULATION NOT PERFORMED WHEN RESULT IS BELOW LINEAR LIMIT      Coagulation No results for input(s): PTP, INR, APTT in the last 72 hours.    No lab exists for component: INREXT, INREXT     Lipid Panel No results found for: CHOL, CHOLPOCT, CHOLX, CHLST, CHOLV, 884269, HDL, LDL, LDLC, DLDLP, 409811, VLDLC, VLDL, TGLX, TRIGL, TRIGP, TGLPOCT, CHHD, CHHDX   BNP No results for input(s): BNPP in the last 72 hours.   Liver Enzymes Recent Labs      07/29/17   0452   TP  6.1*   ALB  3.3*   AP  56   SGOT  18      Thyroid Studies No results found for: T4, T3U, TSH, TSHEXT, TSHEXT         Significant Diagnostic Studies: Ct Abd Pelv W Cont    Result Date: 07/28/2017  EXAM: Abdomen and pelvis with IV contrast INDICATION: Abdominal pain COMPARISON: None. TECHNIQUE: Axial CT imaging of the abdomen and pelvis was performed intravenous contrast. Multiplanar reformats were generated. One or more dose reduction techniques were used on this CT: automated exposure control, adjustment of the Ams and/or kVp according to patient size, and iterative reconstruction techniques.  The specific techniques used on this CT exam have been documented in the patient's electronic medical record. _______________ FINDINGS: LOWER CHEST: Unremarkable. LIVER, BILIARY: No focal hepatic lesion seen however there is periportal edema. No biliary dilation. Gallbladder is unremarkable. PANCREAS: Normal. SPLEEN: Normal. ADRENALS: Normal. KIDNEYS: Small cortical cyst seen in the mid and lower poles of the right kidney. VASCULATURE: Unremarkable LYMPH NODES: No enlarged lymph nodes. GASTROINTESTINAL TRACT: There is diffuse colonic wall thickening of the ascending and transverse colon suggesting colitis either infectious or inflammatory. Appendix is normal. There is also wall thickening of the terminal ileum PELVIC ORGANS: Unremarkable. BONES: No acute or aggressive osseous abnormalities identified. OTHER: None. _______________     IMPRESSION: Colonic wall thickening or transverse colon as well as the terminal ileum suggesting infectious or inflammatory process. Periportal edema        No results found for this or any previous visit.        CC: None

## 2017-07-30 NOTE — Progress Notes (Addendum)
0720-received report from [employer] RN included SBAR MAR and Kardex.  1230-discharged to home, prescriptions given and instructions reviewed and understood by pt.

## 2017-08-03 LAB — CULTURE, BLOOD
Culture result:: NO GROWTH
Culture result:: NO GROWTH

## 2017-08-30 LAB — EKG 12-LEAD
Atrial Rate: 62 {beats}/min
P Axis: 63 degrees
P-R Interval: 136 ms
Q-T Interval: 426 ms
QRS Duration: 106 ms
QTc Calculation (Bazett): 432 ms
R Axis: 98 degrees
T Axis: 69 degrees
Ventricular Rate: 62 {beats}/min

## 2017-08-30 LAB — EKG, 12 LEAD, INITIAL
Atrial Rate: 62 {beats}/min
Calculated P Axis: 63 degrees
Calculated R Axis: 98 degrees
Calculated T Axis: 69 degrees
P-R Interval: 136 ms
Q-T Interval: 426 ms
QRS Duration: 106 ms
QTC Calculation (Bezet): 432 ms
Ventricular Rate: 62 {beats}/min

## 2021-05-26 IMAGING — CR CHEST 2 VWS PA LAT
1 series · 2 of 2 positions shown · non-contrast
Comparison: None

HISTORY/INDICATIONS:  Disability determination
TECHNIQUE: Chest 2 views.

[Series 1: PA · 0.17mm/px · 2 of 2 slices shown]
[im 1/2]
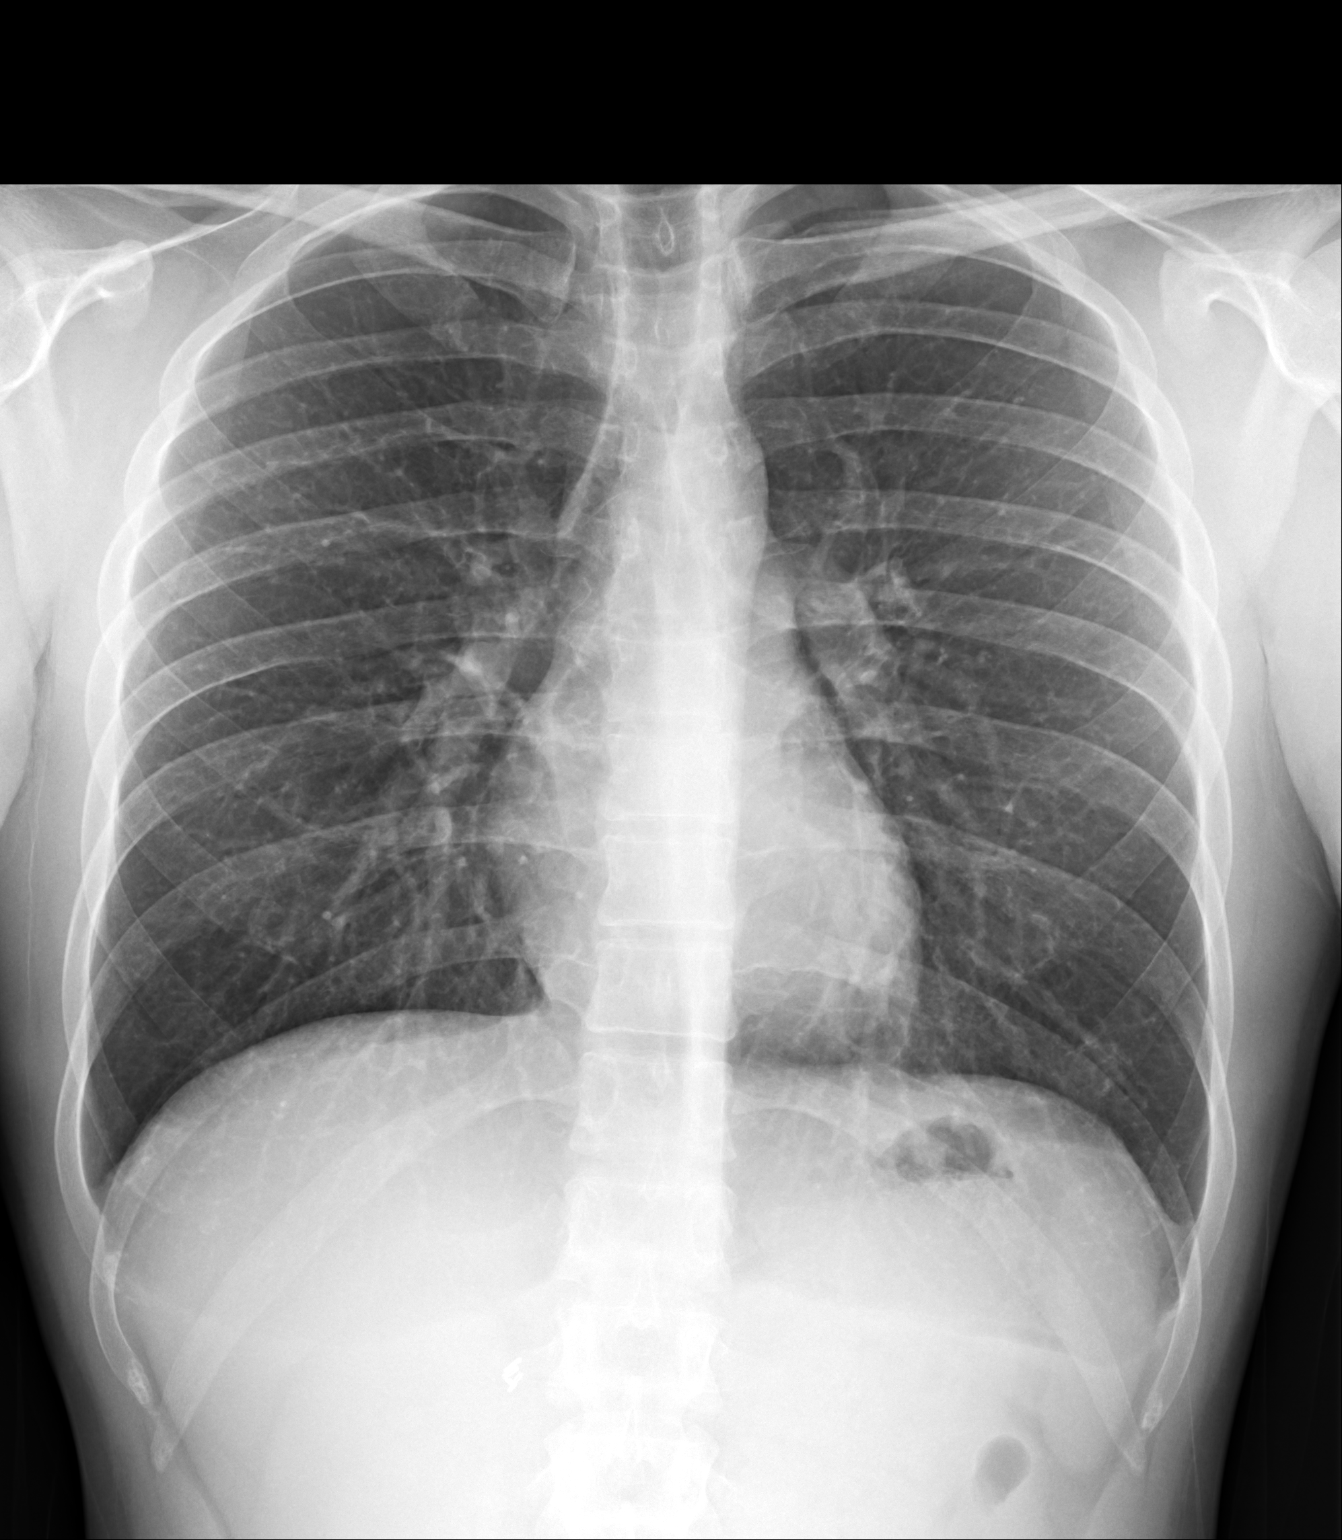
[im 2/2]
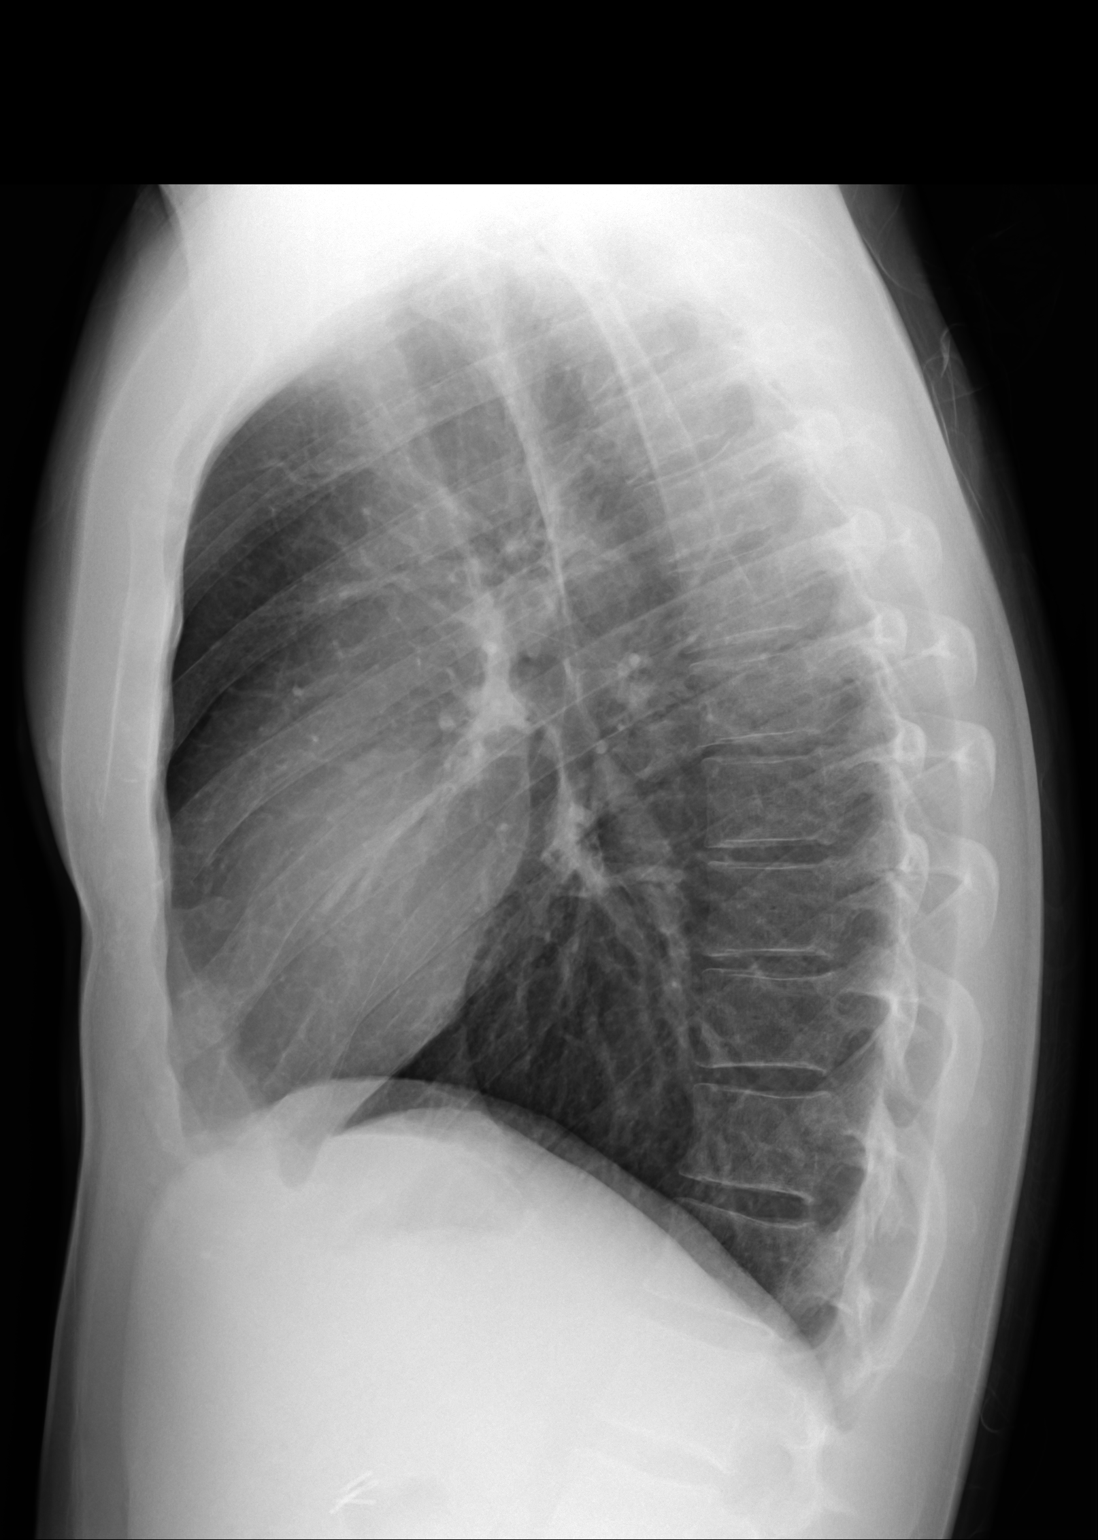

[2 of 2 positions shown; findings below may reference images not displayed]

FINDINGS: Cardiac size and pulmonary vascularity are normal.  The mediastinal silhouette is unremarkable.  The lungs are normal, and there is no pleural fluid.
IMPRESSION: Normal chest.

## 2023-03-31 IMAGING — MR MRI LEG LT WO CONTRAST
7 of 8 series · 36 of 40 positions shown · non-contrast
Comparison: None available.

Images Obtained from [facility]
INDICATION: Pain in left leg
TECHNIQUE: Multiplanar, multisequence imaging of the left leg was performed without contrast. Examination was performed from the left hip through the left knee.

[Series 4: t1_cor · coronal · 4.0mm · 1.47mm/px · 4 of 28 slices shown]
[im 1/28]
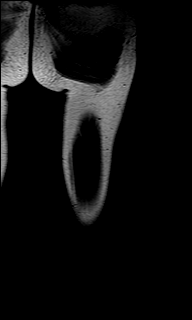
[im 10/28]
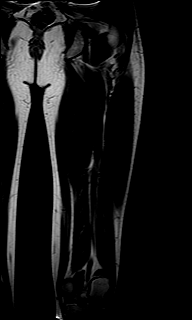
[im 19/28]
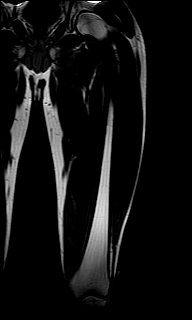
[im 28/28]
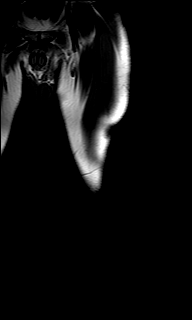

[Series 5: t2_cor_fs · coronal · 4.0mm · 0.73mm/px · 4 of 28 slices shown]
[im 1/28]
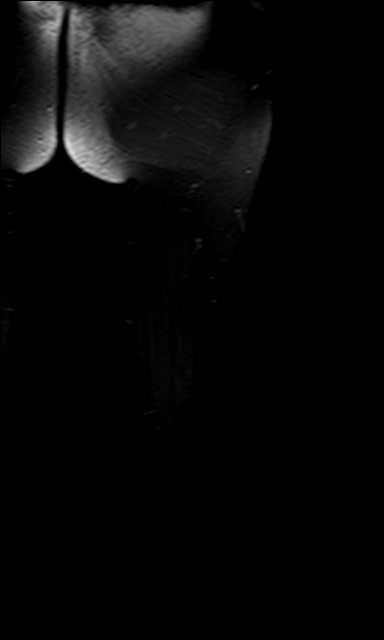
[im 10/28]
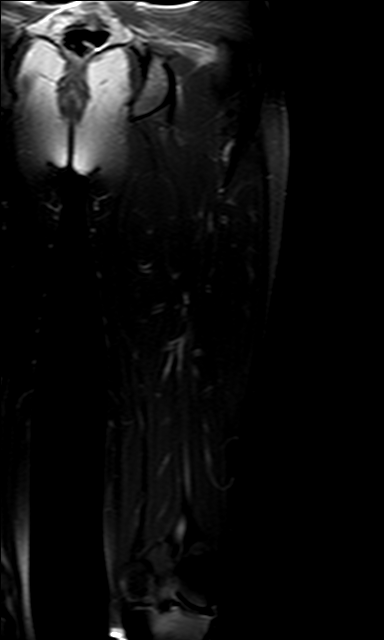
[im 19/28]
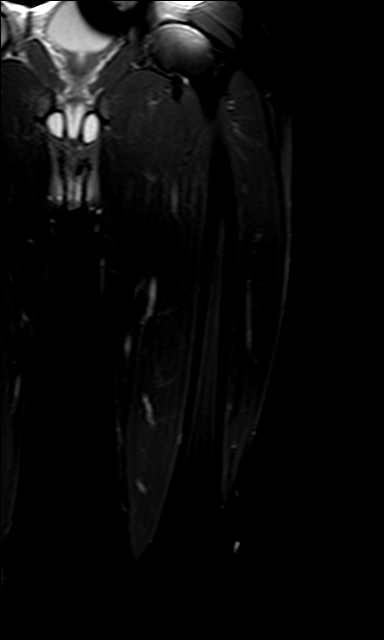
[im 28/28]
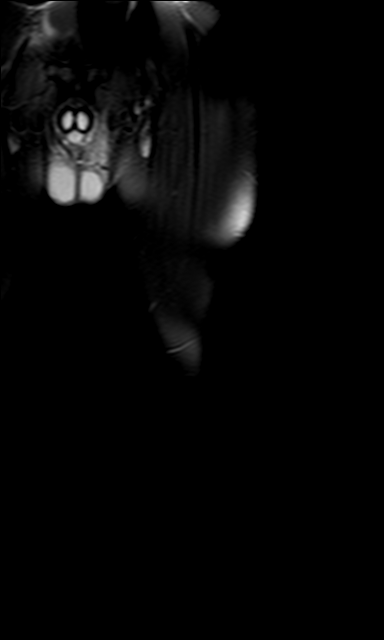

[Series 6: t1_sag · sagittal · 4.0mm · 1.47mm/px · 4 of 28 slices shown]
[im 1/28]
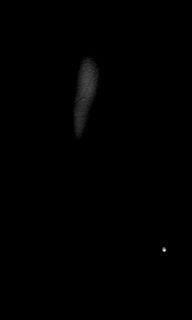
[im 10/28]
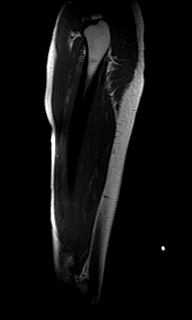
[im 19/28]
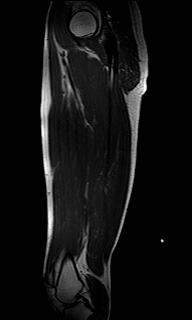
[im 28/28]
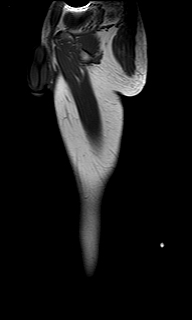

[Series 7: ir_sag · sagittal · 4.0mm · 1.84mm/px · 3 of 24 slices shown]
[im 1/24]
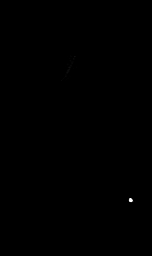
[im 12/24]
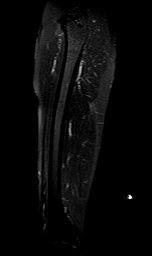
[im 24/24]
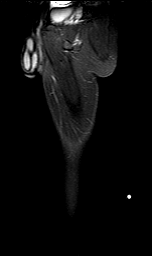

[Series 8: ir_cor · coronal · 4.0mm · 1.84mm/px · 4 of 28 slices shown]
[im 1/28]
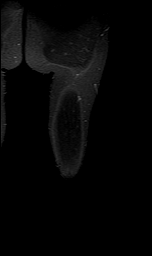
[im 10/28]
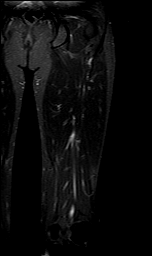
[im 19/28]
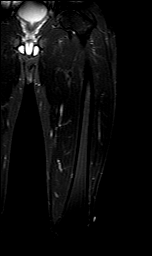
[im 28/28]
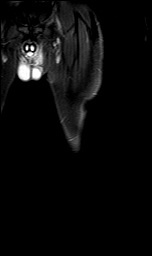

[Series 11: t1_axial · axial · 6.0mm · 0.56mm/px · z∈[-243,+224]mm · 9 of 66 slices shown]
[im 1/66]
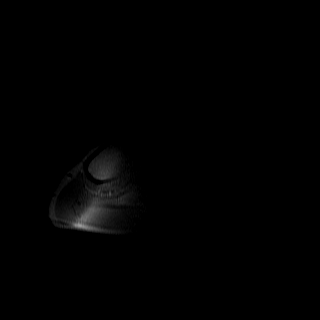
[im 9/66]
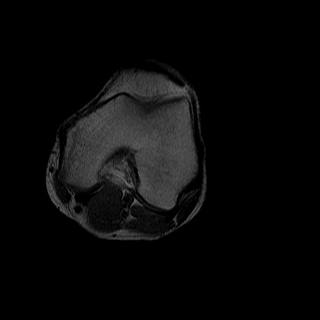
[im 17/66]
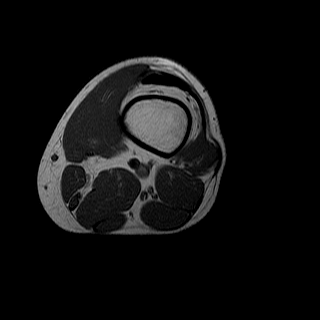
[im 25/66]
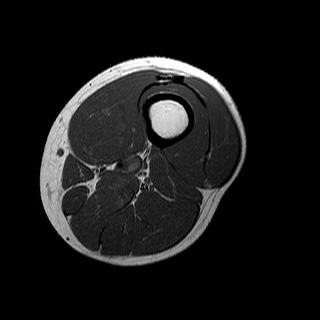
[im 33/66]
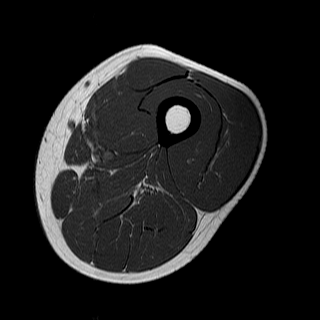
[im 41/66]
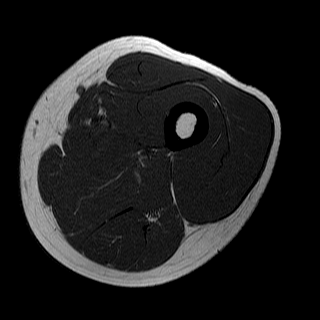
[im 49/66]
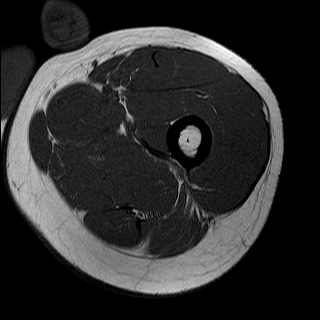
[im 57/66]
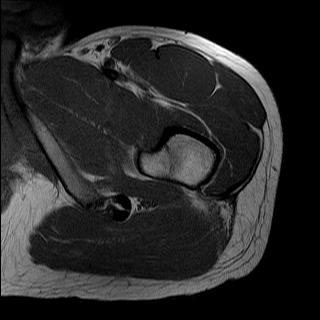
[im 66/66]
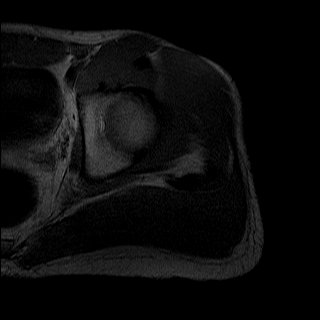

[Series 15: ir_axial · axial · 6.0mm · 0.31mm/px · z∈[-244,+224]mm · 8 of 66 slices shown]
[im 1/66]
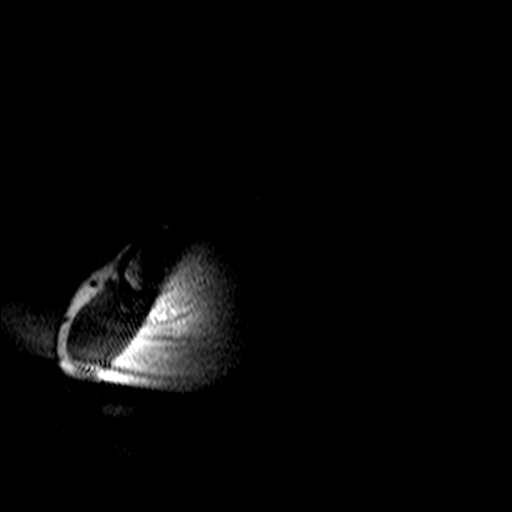
[im 9/66]
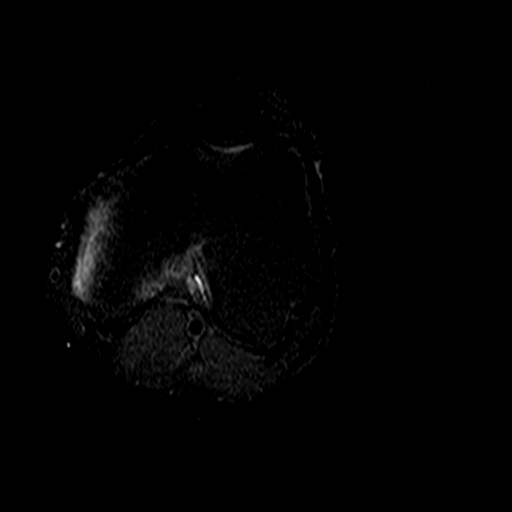
[im 17/66]
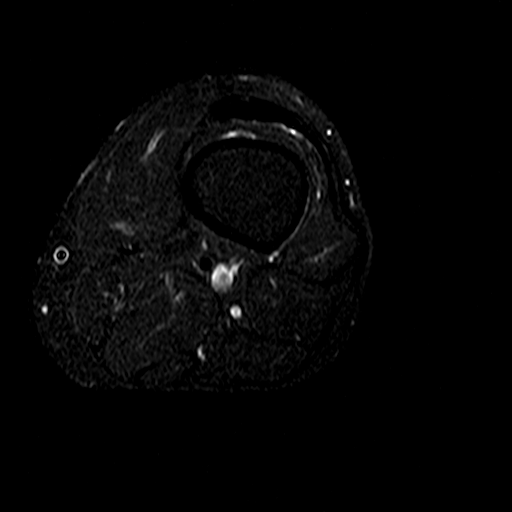
[im 25/66]
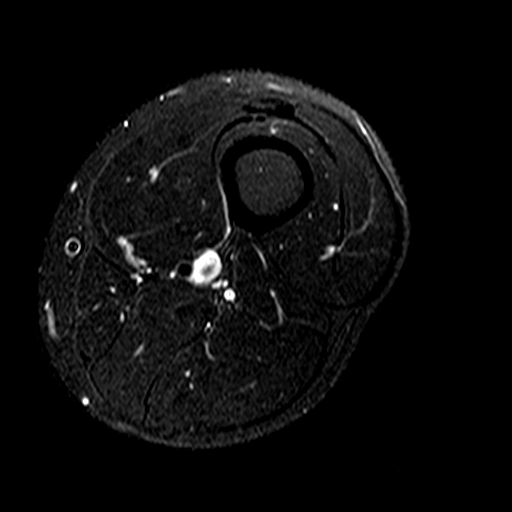
[im 41/66]
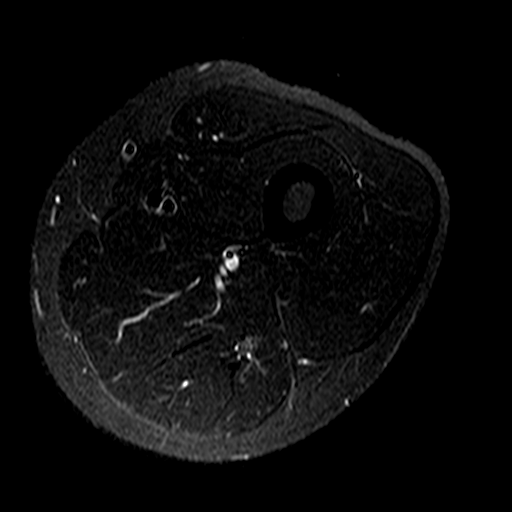
[im 49/66]
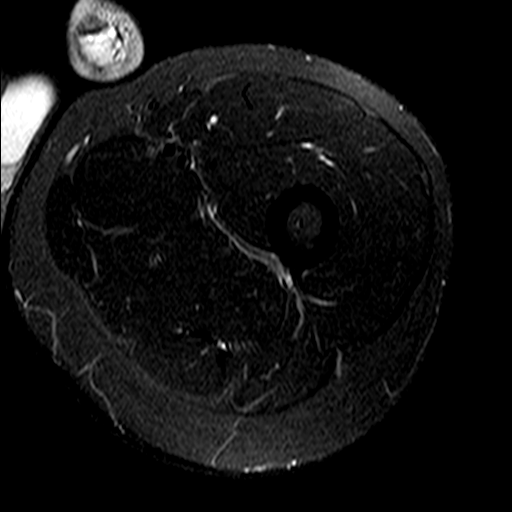
[im 57/66]
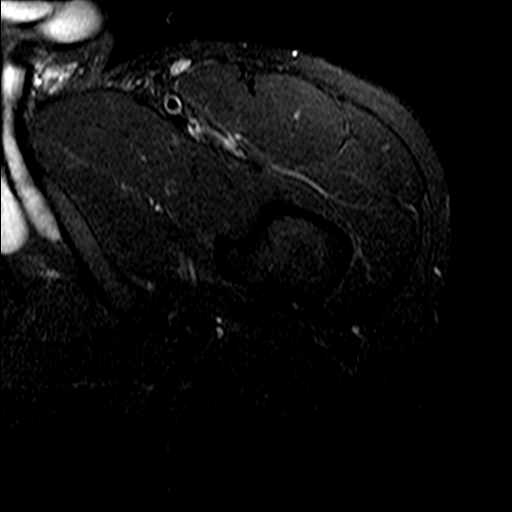
[im 66/66]
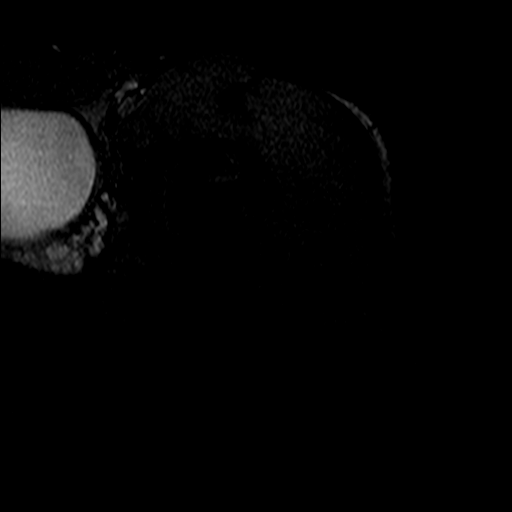

[36 of 40 positions shown; findings below may reference images not displayed]

FINDINGS: Markers are placed overlying the distal aspect of the thigh/knee, denoting the patient directed area of pain.
Moderate focal marrow edema is present within the medial aspect of the medial femoral condyle. No discrete fracture is identified. No additional bone marrow edema.
The visualized musculotendinous structures of the left thigh are intact. No intramuscular edema and no focal fatty atrophy.
The sciatic nerve is normal in size and signal intensity.
Visualized intrapelvic contents are unremarkable.
IMPRESSION: 1.  Moderate focal marrow edema within the medial aspect of the medial femoral condyle, compatible with a contusion (if there is history of direct trauma). Correlate with mechanism of injury.
2.  Intact musculotendinous structures of the left thigh.

## 2023-04-17 IMAGING — MR MRI KNEE LT WO CONTRAST
4 of 6 series · 14 of 40 positions shown · non-contrast
Comparison: 03/31/2023 MRI left thigh

Images Obtained from [facility]
INDICATION: Unspecified internal derangement of left knee
TECHNIQUE: Multiplanar, multiecho imaging of the left knee was performed, including T1-weighted and fluid sensitive sequences without intravenous contrast administration.

[Series 8: t2_axial_fs · axial · left · 3.0mm · 0.25mm/px · z∈[-60,+28]mm · 5 of 28 slices shown]
[im 1/28]
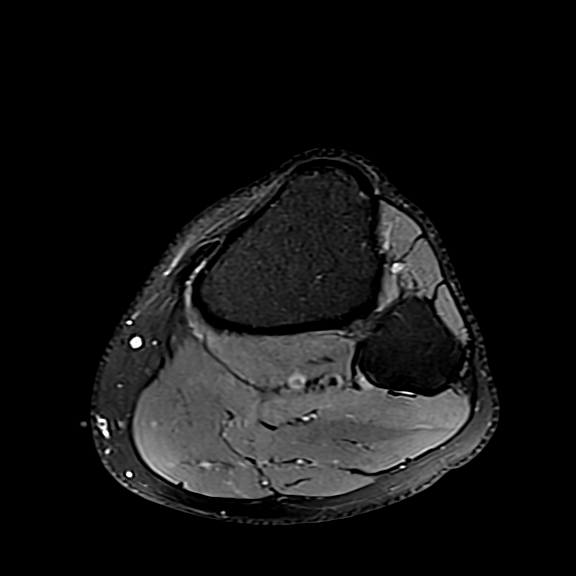
[im 5/28]
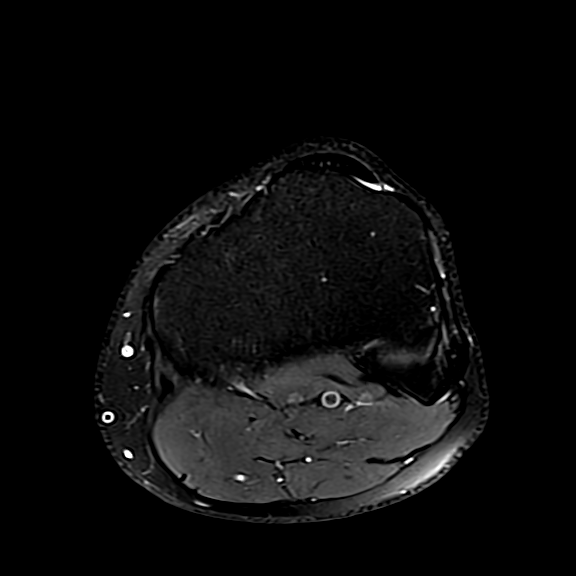
[im 10/28]
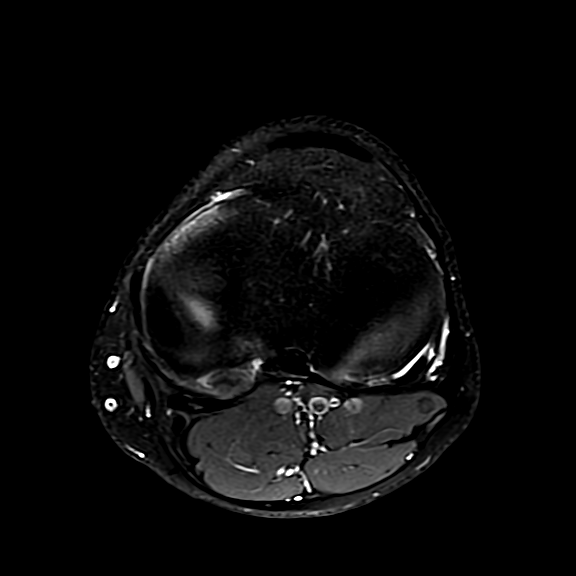
[im 14/28]
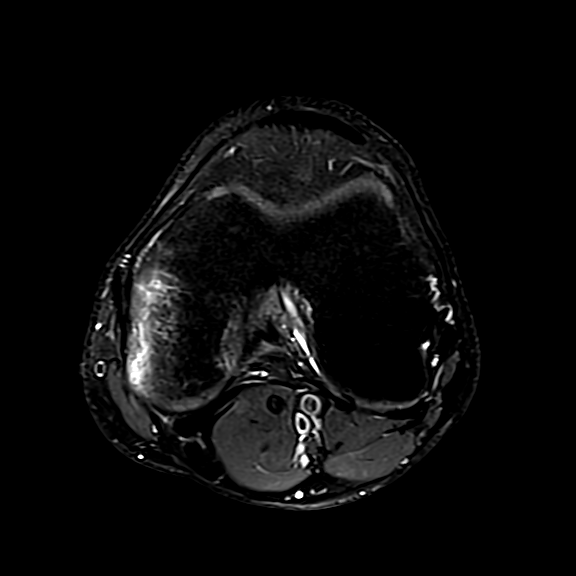
[im 23/28]
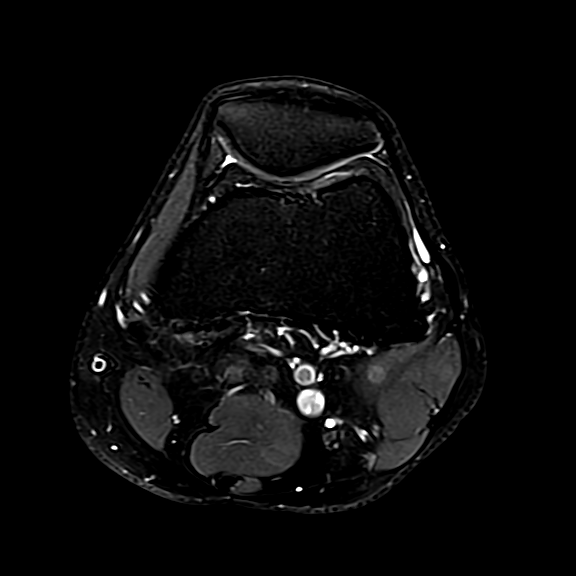

[Series 9: pd_sag_fs · sagittal · left · 3.0mm · 0.25mm/px · 3 of 24 slices shown]
[im 5/24]
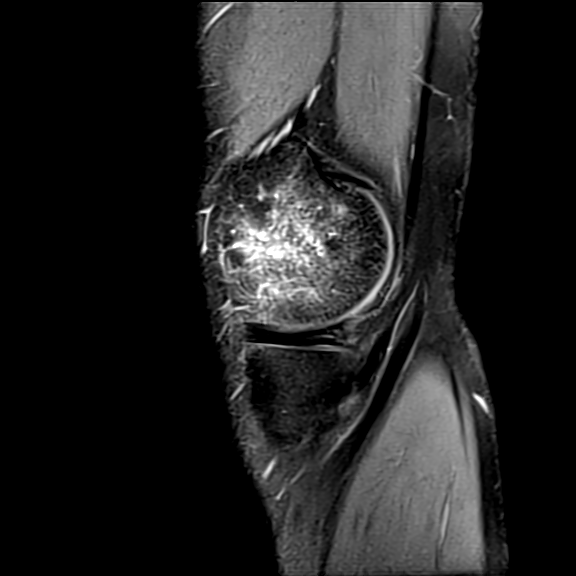
[im 14/24]
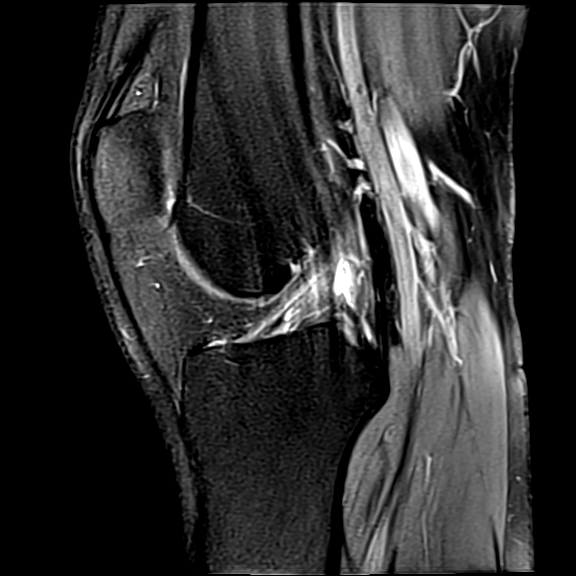
[im 24/24]
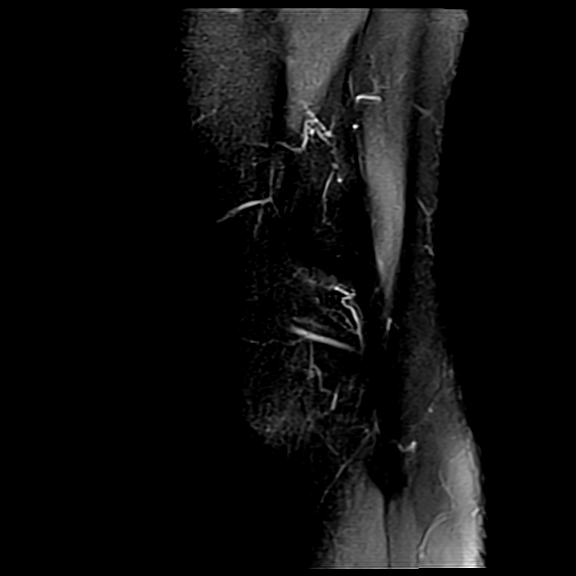

[Series 10: t2_sag_fs · sagittal · left · 3.0mm · 0.25mm/px · 3 of 24 slices shown]
[im 4/24]
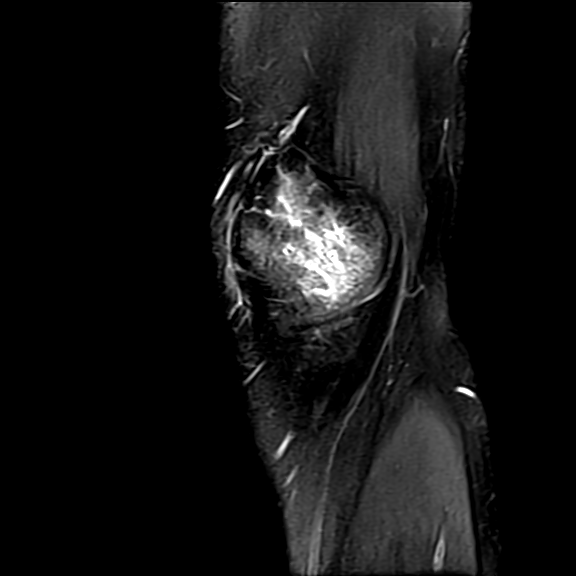
[im 12/24]
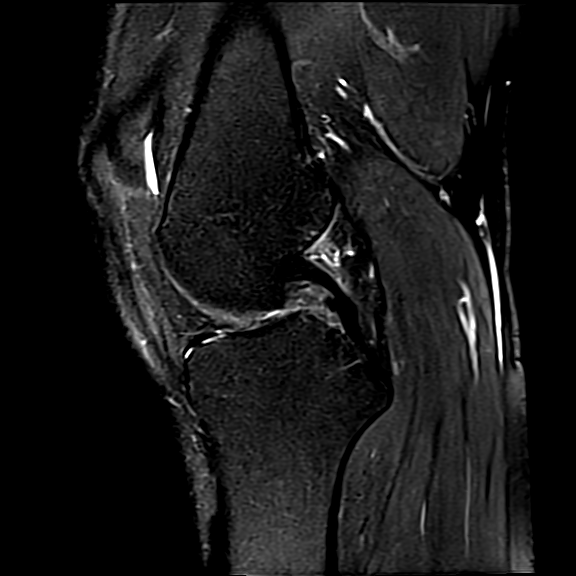
[im 20/24]
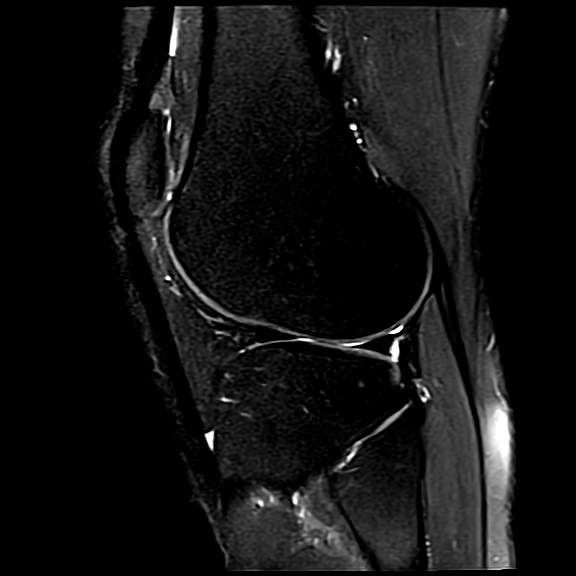

[Series 11: t1_cor · coronal · left · 3.5mm · 0.21mm/px · 3 of 28 slices shown]
[im 4/28]
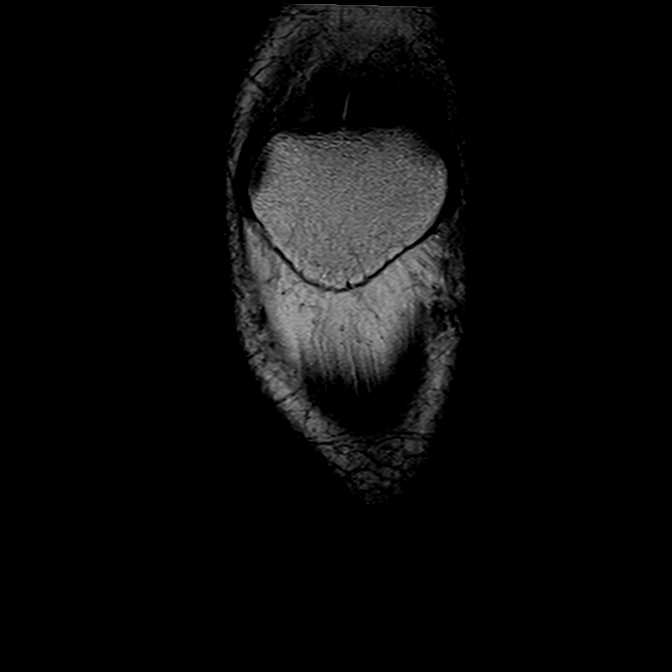
[im 16/28]
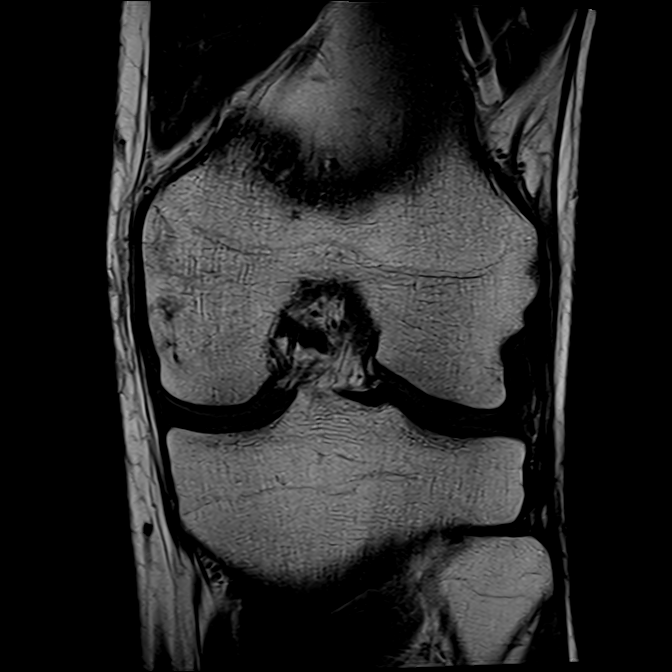
[im 24/28]
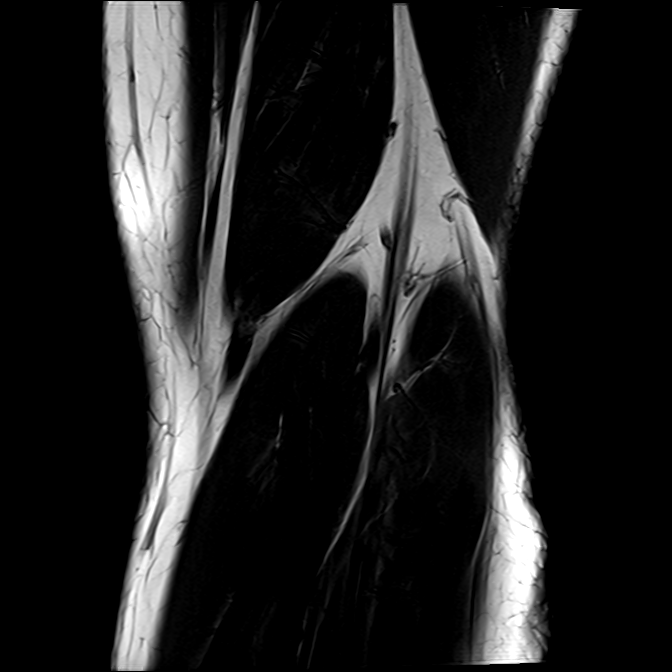

[14 of 40 positions shown; findings below may reference images not displayed]

FINDINGS: MEDIAL MENISCUS:  Intact.
LATERAL MENISCUS:  Intact.
ACL:  Intact.
PCL:  Intact.
MCL:  Intact.
LATERAL LIGAMENTS AND TENDONS:  Intact.
EXTENSOR MECHANISM:  The quadriceps and patellar tendons are intact.
FAT PADS:   Normal.
CARTILAGE:
Patellofemoral compartment:  Mild chondral surface irregularity
Medial compartment:  Intact.
Lateral compartment: Intact.
BONE MARROW: Focus of abnormal osseous edema present involving the medial aspect of the medial femoral condyle primarily in a subcortical position. No discrete fracture lines. No visible soft tissue
mass. Small focus of edema is noted involving the medial aspect of the medial tibial condyle in a subcortical position. Given the history of recent knee trauma, findings most likely related to
nondisplaced healing subcortical trabecular compression fractures. Neoplastic and infectious etiologies are considered less likely. Correlate with radiographs. Recommend repeat evaluation in 6-12
weeks if symptoms have not resolved. Earlier if worsening.
Remaining visualized osseous structures demonstrate normal signal intensity.
Tiny effusion
No cystic or solid masses
No visible adenopathy
IMPRESSION: 1.  Focus of abnormal osseous edema present involving the medial aspect of the medial femoral condyle primarily in a subcortical position. No discrete fracture lines. No visible soft tissue mass.
Small focus of edema is noted involving the medial aspect of the medial tibial condyle in a subcortical position. Given the history of recent knee trauma, findings most likely related to nondisplaced
healing subcortical trabecular compression fractures. Neoplastic and infectious etiologies are considered less likely. Correlate with radiographs. Recommend repeat evaluation in 6-12 weeks if
symptoms have not resolved. Earlier if worsening.
2.  Mild patellofemoral compartment chondral surface irregularity.
3.  Intact ligaments and menisci.
